# Patient Record
Sex: Male | Born: 1975 | Hispanic: Yes | Marital: Married | Smoking: Never smoker
Health system: Southern US, Community
[De-identification: ages and names within clinical notes are randomized; demographics above are authoritative.]

## PROBLEM LIST (undated history)

## (undated) DIAGNOSIS — I10 Essential (primary) hypertension: Secondary | ICD-10-CM

## (undated) DIAGNOSIS — M1990 Unspecified osteoarthritis, unspecified site: Secondary | ICD-10-CM

## (undated) DIAGNOSIS — E119 Type 2 diabetes mellitus without complications: Secondary | ICD-10-CM

## (undated) DIAGNOSIS — Z87442 Personal history of urinary calculi: Secondary | ICD-10-CM

## (undated) HISTORY — PX: CHOLECYSTECTOMY: SHX55

---

## 2004-10-09 ENCOUNTER — Emergency Department (HOSPITAL_COMMUNITY): Admission: EM | Admit: 2004-10-09 | Discharge: 2004-10-09 | Payer: Self-pay | Admitting: Emergency Medicine

## 2005-05-17 ENCOUNTER — Emergency Department (HOSPITAL_COMMUNITY): Admission: EM | Admit: 2005-05-17 | Discharge: 2005-05-17 | Payer: Self-pay | Admitting: Emergency Medicine

## 2005-12-12 ENCOUNTER — Inpatient Hospital Stay (HOSPITAL_COMMUNITY): Admission: EM | Admit: 2005-12-12 | Discharge: 2005-12-14 | Payer: Self-pay | Admitting: Emergency Medicine

## 2005-12-12 ENCOUNTER — Ambulatory Visit: Payer: Self-pay | Admitting: Cardiology

## 2006-07-24 ENCOUNTER — Emergency Department (HOSPITAL_COMMUNITY): Admission: EM | Admit: 2006-07-24 | Discharge: 2006-07-24 | Payer: Self-pay | Admitting: Emergency Medicine

## 2006-07-25 ENCOUNTER — Encounter: Admission: RE | Admit: 2006-07-25 | Discharge: 2006-07-25 | Payer: Self-pay | Admitting: Urology

## 2006-07-26 ENCOUNTER — Ambulatory Visit (HOSPITAL_BASED_OUTPATIENT_CLINIC_OR_DEPARTMENT_OTHER): Admission: RE | Admit: 2006-07-26 | Discharge: 2006-07-26 | Payer: Self-pay | Admitting: Urology

## 2006-09-12 ENCOUNTER — Emergency Department (HOSPITAL_COMMUNITY): Admission: EM | Admit: 2006-09-12 | Discharge: 2006-09-13 | Payer: Self-pay | Admitting: Emergency Medicine

## 2006-09-13 ENCOUNTER — Emergency Department (HOSPITAL_COMMUNITY): Admission: EM | Admit: 2006-09-13 | Discharge: 2006-09-13 | Payer: Self-pay | Admitting: Emergency Medicine

## 2011-09-17 ENCOUNTER — Emergency Department (HOSPITAL_COMMUNITY): Payer: Self-pay

## 2011-09-17 ENCOUNTER — Emergency Department (HOSPITAL_COMMUNITY)
Admission: EM | Admit: 2011-09-17 | Discharge: 2011-09-17 | Discharge status: Against Medical Advice | Payer: Self-pay | Attending: Emergency Medicine | Admitting: Emergency Medicine

## 2011-09-17 DIAGNOSIS — R109 Unspecified abdominal pain: Secondary | ICD-10-CM | POA: Insufficient documentation

## 2011-09-17 DIAGNOSIS — R112 Nausea with vomiting, unspecified: Secondary | ICD-10-CM | POA: Insufficient documentation

## 2011-09-17 DIAGNOSIS — R55 Syncope and collapse: Secondary | ICD-10-CM | POA: Insufficient documentation

## 2011-12-17 ENCOUNTER — Encounter (HOSPITAL_COMMUNITY): Payer: Self-pay | Admitting: *Deleted

## 2011-12-17 ENCOUNTER — Emergency Department (HOSPITAL_COMMUNITY)
Admission: EM | Admit: 2011-12-17 | Discharge: 2011-12-17 | Disposition: A | Discharge status: Home / Self Care | Payer: Self-pay | Attending: Emergency Medicine | Admitting: Emergency Medicine

## 2011-12-17 ENCOUNTER — Emergency Department (HOSPITAL_COMMUNITY): Payer: Self-pay

## 2011-12-17 DIAGNOSIS — K859 Acute pancreatitis without necrosis or infection, unspecified: Secondary | ICD-10-CM | POA: Insufficient documentation

## 2011-12-17 DIAGNOSIS — R110 Nausea: Secondary | ICD-10-CM | POA: Insufficient documentation

## 2011-12-17 DIAGNOSIS — I10 Essential (primary) hypertension: Secondary | ICD-10-CM | POA: Insufficient documentation

## 2011-12-17 DIAGNOSIS — N390 Urinary tract infection, site not specified: Secondary | ICD-10-CM | POA: Insufficient documentation

## 2011-12-17 DIAGNOSIS — R109 Unspecified abdominal pain: Secondary | ICD-10-CM | POA: Insufficient documentation

## 2011-12-17 HISTORY — DX: Essential (primary) hypertension: I10

## 2011-12-17 LAB — CBC
HCT: 45.5 % (ref 39.0–52.0)
MCHC: 34.9 g/dL (ref 30.0–36.0)
MCV: 83.6 fL (ref 78.0–100.0)
Platelets: 270 10*3/uL (ref 150–400)
RBC: 5.44 MIL/uL (ref 4.22–5.81)
RDW: 12.9 % (ref 11.5–15.5)
WBC: 11.4 10*3/uL — ABNORMAL HIGH (ref 4.0–10.5)

## 2011-12-17 LAB — URINALYSIS, ROUTINE W REFLEX MICROSCOPIC
Bilirubin Urine: NEGATIVE
Glucose, UA: NEGATIVE mg/dL
Ketones, ur: NEGATIVE mg/dL
Nitrite: NEGATIVE
Protein, ur: 30 mg/dL — AB
Specific Gravity, Urine: 1.022 (ref 1.005–1.030)
pH: 6.5 (ref 5.0–8.0)

## 2011-12-17 LAB — DIFFERENTIAL
Basophils Relative: 0 % (ref 0–1)
Eosinophils Absolute: 0.1 10*3/uL (ref 0.0–0.7)
Eosinophils Relative: 1 % (ref 0–5)
Lymphocytes Relative: 23 % (ref 12–46)
Monocytes Absolute: 0.8 10*3/uL (ref 0.1–1.0)
Monocytes Relative: 7 % (ref 3–12)
Neutro Abs: 7.9 10*3/uL — ABNORMAL HIGH (ref 1.7–7.7)

## 2011-12-17 LAB — COMPREHENSIVE METABOLIC PANEL
ALT: 38 U/L (ref 0–53)
AST: 25 U/L (ref 0–37)
Albumin: 3.6 g/dL (ref 3.5–5.2)
BUN: 14 mg/dL (ref 6–23)
CO2: 23 mEq/L (ref 19–32)
Calcium: 9.1 mg/dL (ref 8.4–10.5)
Chloride: 107 mEq/L (ref 96–112)
Creatinine, Ser: 0.89 mg/dL (ref 0.50–1.35)
GFR calc Af Amer: >90 mL/min (ref >90)
Potassium: 4 mEq/L (ref 3.5–5.1)
Sodium: 139 mEq/L (ref 135–145)
Total Bilirubin: 0.3 mg/dL (ref 0.3–1.2)
Total Protein: 7.7 g/dL (ref 6.0–8.3)

## 2011-12-17 LAB — URINE MICROSCOPIC-ADD ON

## 2011-12-17 LAB — URINE CULTURE
Colony Count: NO GROWTH
Culture  Setup Time: 201301211440

## 2011-12-17 LAB — LIPASE, BLOOD: Lipase: 125 U/L — ABNORMAL HIGH (ref 11–59)

## 2011-12-17 MED ORDER — IOHEXOL 300 MG/ML  SOLN
20.0000 mL | INTRAMUSCULAR | Status: AC
  Administered 2011-12-17 (×2): 20 mL via ORAL

## 2011-12-17 MED ORDER — IOHEXOL 300 MG/ML  SOLN
100.0000 mL | Freq: Once | INTRAMUSCULAR | Status: AC | PRN
  Administered 2011-12-17: 100 mL via INTRAVENOUS

## 2011-12-17 MED ORDER — ONDANSETRON HCL 4 MG/2ML IJ SOLN
4.0000 mg | Freq: Once | INTRAMUSCULAR | Status: AC
  Administered 2011-12-17: 4 mg via INTRAVENOUS
  Filled 2011-12-17: qty 2

## 2011-12-17 MED ORDER — HYDROCODONE-ACETAMINOPHEN 5-500 MG PO TABS: 1.0000 | ORAL_TABLET | Freq: Four times a day (QID) | ORAL | Status: AC | PRN

## 2011-12-17 MED ORDER — MORPHINE SULFATE 4 MG/ML IJ SOLN
6.0000 mg | Freq: Once | INTRAMUSCULAR | Status: AC
  Administered 2011-12-17: 6 mg via INTRAVENOUS
  Filled 2011-12-17: qty 2

## 2011-12-17 MED ORDER — CEPHALEXIN 500 MG PO CAPS: 500.0000 mg | ORAL_CAPSULE | Freq: Four times a day (QID) | ORAL | Status: AC

## 2011-12-17 MED ORDER — OMEPRAZOLE 20 MG PO CPDR: 20.0000 mg | DELAYED_RELEASE_CAPSULE | Freq: Every day | ORAL | Status: DC

## 2011-12-17 NOTE — ED Provider Notes (Signed)
History     CSN: [id number]  Arrival date & time 12/17/11  0750   First MD Initiated Contact with Patient 12/17/11 [phone]      Chief Complaint  Patient presents with  . Flank Pain    left sided    (Consider location/radiation/quality/duration/timing/severity/associated sxs/prior treatment) Patient is a 36 y.o. male presenting with abdominal pain. The history is provided by the patient.  Abdominal Pain The primary symptoms of the illness include abdominal pain and nausea. The primary symptoms of the illness do not include fever, vomiting, diarrhea, hematemesis or dysuria. The current episode started 3 to 5 hours ago. The onset of the illness was sudden. The problem has been gradually worsening.  Symptoms associated with the illness do not include chills, constipation or hematuria.  Pt states pain woke him up from sleep. Pain mainly on left upper abdomen. Does not radiate. States had a bowel movement this morning, it was black, but normal consistency. Reports nausea, denies vomiting. No similar pain in the past. Pain worsened with movement and palpation.   Past Medical History  Diagnosis Date  . Hypertension     Past Surgical History  Procedure Date  . Cholecystectomy     No family history on file.  History  Substance Use Topics  . Smoking status: Never Smoker   . Smokeless tobacco: Not on file  . Alcohol Use: No      Review of Systems  Constitutional: Negative for fever and chills.  Eyes: Negative.   Respiratory: Negative.   Cardiovascular: Negative.   Gastrointestinal: Positive for nausea and abdominal pain. Negative for vomiting, diarrhea, constipation and hematemesis.  Genitourinary: Negative for dysuria, hematuria, scrotal swelling and testicular pain.  Musculoskeletal: Negative.   Skin: Negative.   Neurological: Negative.   Psychiatric/Behavioral: Negative.     Allergies  Review of patient's allergies indicates no known allergies.  Home Medications  No  current outpatient prescriptions on file.  BP 140/85  Pulse 91  Temp 98 F (36.7 C)  Resp 20  SpO2 97%  Physical Exam  Constitutional: He is oriented to person, place, and time. He appears well-developed and well-nourished.       Uncomfortable appearing  HENT:  Head: Normocephalic.  Eyes: Conjunctivae are normal.  Neck: Neck supple.  Cardiovascular: Normal rate, regular rhythm and normal heart sounds.   Pulmonary/Chest: Effort normal and breath sounds normal. No respiratory distress. He has no wheezes.  Abdominal: Soft. Bowel sounds are normal. He exhibits no mass. There is no rebound and no guarding.       Left upper quadrant tenderness. No CVA tenderness.  Musculoskeletal: Normal range of motion.  Neurological: He is alert and oriented to person, place, and time.  Skin: Skin is warm and dry.  Psychiatric: He has a normal mood and affect.    ED Course  Procedures (including critical care time)  LUQ pain, nausea. Will get labs  Results for orders placed during the hospital encounter of 12/17/11  CBC      Component Value Range   WBC 11.4 (*) 4.0 - 10.5 (K/uL)   RBC 5.44  4.22 - 5.81 (MIL/uL)   Hemoglobin 15.9  13.0 - 17.0 (g/dL)   HCT 63.0  16.0 - 10.9 (%)   MCV 83.6  78.0 - 100.0 (fL)   MCH 29.2  26.0 - 34.0 (pg)   MCHC 34.9  30.0 - 36.0 (g/dL)   RDW 32.3  55.7 - 32.2 (%)   Platelets 270  150 - 400 (K/uL)  DIFFERENTIAL      Component Value Range   Neutrophils Relative 69  43 - 77 (%)   Neutro Abs 7.9 (*) 1.7 - 7.7 (K/uL)   Lymphocytes Relative 23  12 - 46 (%)   Lymphs Abs 2.6  0.7 - 4.0 (K/uL)   Monocytes Relative 7  3 - 12 (%)   Monocytes Absolute 0.8  0.1 - 1.0 (K/uL)   Eosinophils Relative 1  0 - 5 (%)   Eosinophils Absolute 0.1  0.0 - 0.7 (K/uL)   Basophils Relative 0  0 - 1 (%)   Basophils Absolute 0.1  0.0 - 0.1 (K/uL)  COMPREHENSIVE METABOLIC PANEL      Component Value Range   Sodium 139  135 - 145 (mEq/L)   Potassium 4.0  3.5 - 5.1 (mEq/L)   Chloride  107  96 - 112 (mEq/L)   CO2 23  19 - 32 (mEq/L)   Glucose, Bld 132 (*) 70 - 99 (mg/dL)   BUN 14  6 - 23 (mg/dL)   Creatinine, Ser 7.82  0.50 - 1.35 (mg/dL)   Calcium 9.1  8.4 - 95.6 (mg/dL)   Total Protein 7.7  6.0 - 8.3 (g/dL)   Albumin 3.6  3.5 - 5.2 (g/dL)   AST 25  0 - 37 (U/L)   ALT 38  0 - 53 (U/L)   Alkaline Phosphatase 75  39 - 117 (U/L)   Total Bilirubin 0.3  0.3 - 1.2 (mg/dL)   GFR calc non Af Amer >90  >90 (mL/min)   GFR calc Af Amer >90  >90 (mL/min)  LIPASE, BLOOD      Component Value Range   Lipase 125 (*) 11 - 59 (U/L)  URINALYSIS, ROUTINE W REFLEX MICROSCOPIC      Component Value Range   Color, Urine AMBER (*) YELLOW    APPearance CLOUDY (*) CLEAR    Specific Gravity, Urine 1.022  1.005 - 1.030    pH 6.5  5.0 - 8.0    Glucose, UA NEGATIVE  NEGATIVE (mg/dL)   Hgb urine dipstick LARGE (*) NEGATIVE    Bilirubin Urine NEGATIVE  NEGATIVE    Ketones, ur NEGATIVE  NEGATIVE (mg/dL)   Protein, ur 30 (*) NEGATIVE (mg/dL)   Urobilinogen, UA 0.2  0.0 - 1.0 (mg/dL)   Nitrite NEGATIVE  NEGATIVE    Leukocytes, UA SMALL (*) NEGATIVE   URINE MICROSCOPIC-ADD ON      Component Value Range   Squamous Epithelial / LPF RARE  RARE    WBC, UA 7-10  <3 (WBC/hpf)   RBC / HPF TOO NUMEROUS TO COUNT  <3 (RBC/hpf)   Bacteria, UA FEW (*) RARE     Lipase elevated. Will get CT for further evaluation.    Ct Abdomen Pelvis W Contrast  12/17/2011  *RADIOLOGY REPORT*  Clinical Data: Left flank pain, pancreatitis  CT ABDOMEN AND PELVIS WITH CONTRAST  Technique:  Multidetector CT imaging of the abdomen and pelvis was performed following the standard protocol during bolus administration of intravenous contrast.  Contrast: OMNIPAQUE IOHEXOL 300 MG/ML IV SOLN  Comparison: 07/24/2006  Findings: Lung bases are unremarkable.  Sagittal images of the spine shows disc space flattening with vacuum disc phenomenon and mild posterior endplate spurring at L5-S1 level.  Mild posterior spurring noted at  L1 L2 level.  The patient is status post cholecystectomy.  There is mild fatty infiltration of the liver.  No intrahepatic biliary ductal dilatation. No focal hepatic mass. Enhanced pancreas and left  adrenal gland is unremarkable.  There is a low density nodule in the right adrenal gland measures 1.7 cm probable adenoma.  The kidneys show symmetrical enhancement.  No hydronephrosis or hydroureter.  There is a probable cyst in lower pole of the right kidney measures 1.1 cm.  A cyst in upper pole of the left kidney measures 2.4 cm.  There is a cyst in the mid pole anterior aspect of the left kidney measures 2.7 cm increased from prior exam.  A cyst is noted in mid pole posterior aspect of the left kidney measures 1.5 C.  In axial image 35 there is a calcified calculus probable nonobstructive in the left UPJ measures 6.6 by 5.5 mm.  No distal ureteral calculi are noted bilaterally.  No small bowel obstruction.  No ascites or free air.  No adenopathy.  There is no evidence of pancreatitis.  No aortic aneurysm.  No pericecal inflammation.  Normal appendix is clearly visualized in axial image 48.  The urinary bladder is unremarkable.  Prostate gland seminal vesicles are unremarkable.  No destructive bony lesions are noted in pelvis.  Stool noted in the cecum and right colon.   IMPRESSION:  1.  Mild hepatic fatty infiltration. 2.  Probable right adrenal adenoma measures 1.7 cm.  Follow-up examination in 3-6 months is suggested to assure stability. 3. Bilateral renal cysts.  There is a calcified calculus probable nonobstructive in the left UPJ measures 6.6 x 5.5 mm. 4.  No small bowel obstruction. 5.  Normal appendix. 6.  Degenerative changes lumbar spine.  Original Report Authenticated By: Natasha Mead, M.D.   CT unremarkable. Pt's pain has resolved. He wants to go home. Will d/c home with close follow up.  1. Pancreatitis   2. Urinary tract infection      Medical screening examination/treatment/procedure(s) were  performed by non-physician practitioner and as supervising physician I was immediately available for consultation/collaboration. Osvaldo Human, M.D.        Lottie Mussel, PA 12/17/11 1630  Carleene Cooper III, MD 12/19/11 [phone]

## 2011-12-17 NOTE — ED Notes (Signed)
Patient reports left flank pain starting this am at 0400. Patient also states black stools with nausea associated.  No vomiting reported.

## 2011-12-17 NOTE — ED Notes (Signed)
Pt has urinal at bedside. Aware of need for sample.

## 2011-12-20 ENCOUNTER — Encounter (HOSPITAL_COMMUNITY): Payer: Self-pay

## 2011-12-20 ENCOUNTER — Emergency Department (HOSPITAL_COMMUNITY)
Admission: EM | Admit: 2011-12-20 | Discharge: 2011-12-20 | Disposition: A | Discharge status: Home / Self Care | Payer: Self-pay | Attending: Emergency Medicine | Admitting: Emergency Medicine

## 2011-12-20 DIAGNOSIS — N200 Calculus of kidney: Secondary | ICD-10-CM

## 2011-12-20 DIAGNOSIS — I10 Essential (primary) hypertension: Secondary | ICD-10-CM | POA: Insufficient documentation

## 2011-12-20 DIAGNOSIS — R109 Unspecified abdominal pain: Secondary | ICD-10-CM | POA: Insufficient documentation

## 2011-12-20 DIAGNOSIS — Z79899 Other long term (current) drug therapy: Secondary | ICD-10-CM | POA: Insufficient documentation

## 2011-12-20 DIAGNOSIS — N201 Calculus of ureter: Secondary | ICD-10-CM | POA: Insufficient documentation

## 2011-12-20 DIAGNOSIS — R319 Hematuria, unspecified: Secondary | ICD-10-CM | POA: Insufficient documentation

## 2011-12-20 LAB — URINALYSIS, ROUTINE W REFLEX MICROSCOPIC
Bilirubin Urine: NEGATIVE
Glucose, UA: NEGATIVE mg/dL
Ketones, ur: NEGATIVE mg/dL
Protein, ur: 30 mg/dL — AB
pH: 5.5 (ref 5.0–8.0)

## 2011-12-20 LAB — URINE MICROSCOPIC-ADD ON

## 2011-12-20 MED ORDER — ONDANSETRON 4 MG PO TBDP
8.0000 mg | ORAL_TABLET | Freq: Once | ORAL | Status: AC
  Administered 2011-12-20: 8 mg via ORAL
  Filled 2011-12-20: qty 2

## 2011-12-20 MED ORDER — HYDROMORPHONE HCL PF 1 MG/ML IJ SOLN
1.0000 mg | Freq: Once | INTRAMUSCULAR | Status: AC
  Administered 2011-12-20: 1 mg via INTRAVENOUS
  Filled 2011-12-20: qty 1

## 2011-12-20 MED ORDER — OXYCODONE-ACETAMINOPHEN 5-325 MG PO TABS: 2.0000 | ORAL_TABLET | ORAL | Status: AC | PRN

## 2011-12-20 NOTE — ED Notes (Signed)
Pt presents with LUQ abdominal pain that began again today.  Pt seen here for same last week, began abx treatment.  Pt reports onset of hematuria that began yesterday.  Pt denies any pain with voiding.

## 2011-12-20 NOTE — ED Provider Notes (Signed)
History     CSN: [id number]  Arrival date & time 12/20/11  1020   First MD Initiated Contact with Patient 12/20/11 1059      Chief Complaint  Patient presents with  . Abdominal Pain    (Consider location/radiation/quality/duration/timing/severity/associated sxs/prior treatment) HPI Comments: Patient presents left-sided abdominal pain. This started approximately 4 days ago. It comes and goes. He was seen here on Monday for the same type of pain. He had a CT scan of the abdomen and pelvis which did show a probable nonobstructing stone 4 x 5 cm in the left UPJ. He also had a very mildly elevated lipase at 125. Other blood work was unremarkable. His urine showed some blood but otherwise is unremarkable. He states that the pain came back worse this morning. It is nonradiating. He also noticed some blood in his urine with the pain today. He does have a history of kidney stones in the past but does not currently have a urologist. Denies any nausea vomiting or fevers. The pain is not worse with movement. Not worse with eating.  Patient is a 36 y.o. male presenting with abdominal pain. The history is provided by the patient.  Abdominal Pain The primary symptoms of the illness include abdominal pain. The primary symptoms of the illness do not include fever, fatigue, shortness of breath, nausea, vomiting or diarrhea.  Additional symptoms associated with the illness include hematuria. Symptoms associated with the illness do not include chills, diaphoresis, frequency or back pain.    Past Medical History  Diagnosis Date  . Hypertension     Past Surgical History  Procedure Date  . Cholecystectomy     No family history on file.  History  Substance Use Topics  . Smoking status: Never Smoker   . Smokeless tobacco: Not on file  . Alcohol Use: No      Review of Systems  Constitutional: Negative for fever, chills, diaphoresis and fatigue.  HENT: Negative for congestion, rhinorrhea and  sneezing.   Eyes: Negative.   Respiratory: Negative for cough, chest tightness and shortness of breath.   Cardiovascular: Negative for chest pain and leg swelling.  Gastrointestinal: Positive for abdominal pain. Negative for nausea, vomiting, diarrhea and blood in stool.  Genitourinary: Positive for hematuria. Negative for frequency, flank pain, difficulty urinating and testicular pain.  Musculoskeletal: Negative for back pain and arthralgias.  Skin: Negative for rash.  Neurological: Negative for dizziness, speech difficulty, weakness, numbness and headaches.    Allergies  Review of patient's allergies indicates no known allergies.  Home Medications   Current Outpatient Rx  Name Route Sig Dispense Refill  . AMLODIPINE BESYLATE 10 MG PO TABS Oral Take 10 mg by mouth daily.    . CEPHALEXIN 500 MG PO CAPS Oral Take 1 capsule (500 mg total) by mouth 4 (four) times daily. 28 capsule 0  . HYDROCODONE-ACETAMINOPHEN 5-500 MG PO TABS Oral Take 1-2 tablets by mouth every 6 (six) hours as needed for pain. 15 tablet 0  . LISINOPRIL 20 MG PO TABS Oral Take 20 mg by mouth daily.    Marland Kitchen OMEPRAZOLE 20 MG PO CPDR Oral Take 1 capsule (20 mg total) by mouth daily. 30 capsule 0  . OXYCODONE-ACETAMINOPHEN 5-325 MG PO TABS Oral Take 2 tablets by mouth every 4 (four) hours as needed for pain. 15 tablet 0    BP 148/97  Pulse 93  Temp(Src) 98.9 F (37.2 C) (Oral)  Resp 16  Ht 5\' 11"  (1.803 m)  Wt 265 lb (  120.203 kg)  BMI 36.96 kg/m2  SpO2 95%  Physical Exam  Constitutional: He is oriented to person, place, and time. He appears well-developed and well-nourished.  HENT:  Head: Normocephalic and atraumatic.  Eyes: Pupils are equal, round, and reactive to light.  Neck: Normal range of motion. Neck supple.  Cardiovascular: Normal rate, regular rhythm and normal heart sounds.   Pulmonary/Chest: Effort normal and breath sounds normal. No respiratory distress. He has no wheezes. He has no rales. He exhibits  no tenderness.  Abdominal: Soft. Bowel sounds are normal. There is tenderness. There is no rebound and no guarding.       Moderate tenderness to the left midabdomen. There is no pain in epigastric area. No CVA tenderness. No testicular or inguinal tenderness.  Musculoskeletal: Normal range of motion. He exhibits no edema.  Lymphadenopathy:    He has no cervical adenopathy.  Neurological: He is alert and oriented to person, place, and time.  Skin: Skin is warm and dry. No rash noted.  Psychiatric: He has a normal mood and affect.    ED Course  Procedures (including critical care time)  Results for orders placed during the hospital encounter of 12/20/11  URINALYSIS, ROUTINE W REFLEX MICROSCOPIC      Component Value Range   Color, Urine RED (*) YELLOW    APPearance CLOUDY (*) CLEAR    Specific Gravity, Urine 1.020  1.005 - 1.030    pH 5.5  5.0 - 8.0    Glucose, UA NEGATIVE  NEGATIVE (mg/dL)   Hgb urine dipstick LARGE (*) NEGATIVE    Bilirubin Urine NEGATIVE  NEGATIVE    Ketones, ur NEGATIVE  NEGATIVE (mg/dL)   Protein, ur 30 (*) NEGATIVE (mg/dL)   Urobilinogen, UA 0.2  0.0 - 1.0 (mg/dL)   Nitrite NEGATIVE  NEGATIVE    Leukocytes, UA SMALL (*) NEGATIVE   URINE MICROSCOPIC-ADD ON      Component Value Range   Squamous Epithelial / LPF RARE  RARE    WBC, UA 3-6  <3 (WBC/hpf)   RBC / HPF TOO NUMEROUS TO COUNT  <3 (RBC/hpf)   Bacteria, UA MANY (*) RARE    Ct Abdomen Pelvis W Contrast  12/17/2011  *RADIOLOGY REPORT*  Clinical Data: Left flank pain, pancreatitis  CT ABDOMEN AND PELVIS WITH CONTRAST  Technique:  Multidetector CT imaging of the abdomen and pelvis was performed following the standard protocol during bolus administration of intravenous contrast.  Contrast: OMNIPAQUE IOHEXOL 300 MG/ML IV SOLN  Comparison: 07/24/2006  Findings: Lung bases are unremarkable.  Sagittal images of the spine shows disc space flattening with vacuum disc phenomenon and mild posterior endplate  spurring at L5-S1 level.  Mild posterior spurring noted at L1 L2 level.  The patient is status post cholecystectomy.  There is mild fatty infiltration of the liver.  No intrahepatic biliary ductal dilatation. No focal hepatic mass. Enhanced pancreas and left adrenal gland is unremarkable.  There is a low density nodule in the right adrenal gland measures 1.7 cm probable adenoma.  The kidneys show symmetrical enhancement.  No hydronephrosis or hydroureter.  There is a probable cyst in lower pole of the right kidney measures 1.1 cm.  A cyst in upper pole of the left kidney measures 2.4 cm.  There is a cyst in the mid pole anterior aspect of the left kidney measures 2.7 cm increased from prior exam.  A cyst is noted in mid pole posterior aspect of the left kidney measures 1.5 C.  In  axial image 35 there is a calcified calculus probable nonobstructive in the left UPJ measures 6.6 by 5.5 mm.  No distal ureteral calculi are noted bilaterally.  No small bowel obstruction.  No ascites or free air.  No adenopathy.  There is no evidence of pancreatitis.  No aortic aneurysm.  No pericecal inflammation.  Normal appendix is clearly visualized in axial image 48.  The urinary bladder is unremarkable.  Prostate gland seminal vesicles are unremarkable.  No destructive bony lesions are noted in pelvis.  Stool noted in the cecum and right colon.   IMPRESSION:  1.  Mild hepatic fatty infiltration. 2.  Probable right adrenal adenoma measures 1.7 cm.  Follow-up examination in 3-6 months is suggested to assure stability. 3. Bilateral renal cysts.  There is a calcified calculus probable nonobstructive in the left UPJ measures 6.6 x 5.5 mm. 4.  No small bowel obstruction. 5.  Normal appendix. 6.  Degenerative changes lumbar spine.  Original Report Authenticated By: Natasha Mead, M.D.     No results found.   1. Kidney stone       MDM  Patient presents with left flank pain and hematuria. He does have a history kidney stones. His  symptoms are not consistent with pancreatitis. There was a noted 4 x 5 cm stone at the left UPJ on his prior CT scan this could be the cause of the patient's pain. Nothing else remarkable was noted on the CT scan. Do not feel that repeat imaging is indicated today. He has no fevers no vomiting, no other symptoms that would be consistent with pancreatitis. Do not feel that repeat blood work is indicated today will recheck his urine control his pain and reevaluate.  1300 patient is feeling much better after pain medication is ready to go home. Will discharge with pain medicine, referral to urology. He is aware of the right adrenal adenoma that needs followup      Rolan Bucco, MD 12/20/11 1301

## 2012-03-03 ENCOUNTER — Encounter (HOSPITAL_COMMUNITY): Payer: Self-pay | Admitting: *Deleted

## 2012-03-03 ENCOUNTER — Emergency Department (HOSPITAL_COMMUNITY): Payer: Self-pay

## 2012-03-03 ENCOUNTER — Emergency Department (HOSPITAL_COMMUNITY)
Admission: EM | Admit: 2012-03-03 | Discharge: 2012-03-03 | Disposition: A | Discharge status: Home / Self Care | Payer: Self-pay | Attending: Emergency Medicine | Admitting: Emergency Medicine

## 2012-03-03 DIAGNOSIS — N509 Disorder of male genital organs, unspecified: Secondary | ICD-10-CM | POA: Insufficient documentation

## 2012-03-03 DIAGNOSIS — M545 Low back pain, unspecified: Secondary | ICD-10-CM | POA: Insufficient documentation

## 2012-03-03 DIAGNOSIS — R109 Unspecified abdominal pain: Secondary | ICD-10-CM | POA: Insufficient documentation

## 2012-03-03 DIAGNOSIS — N50819 Testicular pain, unspecified: Secondary | ICD-10-CM

## 2012-03-03 DIAGNOSIS — I10 Essential (primary) hypertension: Secondary | ICD-10-CM | POA: Insufficient documentation

## 2012-03-03 DIAGNOSIS — M549 Dorsalgia, unspecified: Secondary | ICD-10-CM

## 2012-03-03 DIAGNOSIS — Z79899 Other long term (current) drug therapy: Secondary | ICD-10-CM | POA: Insufficient documentation

## 2012-03-03 LAB — DIFFERENTIAL
Basophils Relative: 0 % (ref 0–1)
Lymphocytes Relative: 21 % (ref 12–46)
Lymphs Abs: 2.3 10*3/uL (ref 0.7–4.0)
Monocytes Absolute: 0.8 10*3/uL (ref 0.1–1.0)
Monocytes Relative: 7 % (ref 3–12)
Neutro Abs: 7.9 10*3/uL — ABNORMAL HIGH (ref 1.7–7.7)
Neutrophils Relative %: 72 % (ref 43–77)

## 2012-03-03 LAB — COMPREHENSIVE METABOLIC PANEL
ALT: 31 U/L (ref 0–53)
Alkaline Phosphatase: 69 U/L (ref 39–117)
BUN: 13 mg/dL (ref 6–23)
CO2: 23 mEq/L (ref 19–32)
Chloride: 103 mEq/L (ref 96–112)
GFR calc Af Amer: >90 mL/min (ref >90)
Glucose, Bld: 102 mg/dL — ABNORMAL HIGH (ref 70–99)
Potassium: 4.5 mEq/L (ref 3.5–5.1)
Sodium: 134 mEq/L — ABNORMAL LOW (ref 135–145)
Total Bilirubin: 0.4 mg/dL (ref 0.3–1.2)
Total Protein: 7.2 g/dL (ref 6.0–8.3)

## 2012-03-03 LAB — CBC
HCT: 45.1 % (ref 39.0–52.0)
Hemoglobin: 15.4 g/dL (ref 13.0–17.0)
RBC: 5.35 MIL/uL (ref 4.22–5.81)
WBC: 11 10*3/uL — ABNORMAL HIGH (ref 4.0–10.5)

## 2012-03-03 LAB — URINALYSIS, MICROSCOPIC ONLY
Bilirubin Urine: NEGATIVE
Ketones, ur: NEGATIVE mg/dL
Nitrite: NEGATIVE
Protein, ur: NEGATIVE mg/dL
pH: 7 (ref 5.0–8.0)

## 2012-03-03 MED ORDER — OXYCODONE-ACETAMINOPHEN 5-325 MG PO TABS: 2.0000 | ORAL_TABLET | ORAL | Status: AC | PRN

## 2012-03-03 MED ORDER — HYDROMORPHONE HCL PF 1 MG/ML IJ SOLN
1.0000 mg | Freq: Once | INTRAMUSCULAR | Status: AC
  Administered 2012-03-03: 1 mg via INTRAVENOUS
  Filled 2012-03-03: qty 1

## 2012-03-03 MED ORDER — KETOROLAC TROMETHAMINE 30 MG/ML IJ SOLN: 30.0000 mg | Freq: Once | INTRAMUSCULAR | Status: DC

## 2012-03-03 MED ORDER — SODIUM CHLORIDE 0.9 % IV SOLN
INTRAVENOUS | Status: DC
  Administered 2012-03-03: 10:00:00 via INTRAVENOUS

## 2012-03-03 MED ORDER — TAMSULOSIN HCL 0.4 MG PO CAPS: 0.4000 mg | ORAL_CAPSULE | Freq: Every day | ORAL | Status: DC

## 2012-03-03 MED ORDER — KETOROLAC TROMETHAMINE 30 MG/ML IJ SOLN
30.0000 mg | Freq: Once | INTRAMUSCULAR | Status: AC
  Administered 2012-03-03: 30 mg via INTRAMUSCULAR
  Filled 2012-03-03: qty 1

## 2012-03-03 MED ORDER — ONDANSETRON HCL 4 MG/2ML IJ SOLN
4.0000 mg | Freq: Once | INTRAMUSCULAR | Status: AC
  Administered 2012-03-03: 4 mg via INTRAVENOUS
  Filled 2012-03-03: qty 2

## 2012-03-03 MED ORDER — ONDANSETRON HCL 4 MG PO TABS: 4.0000 mg | ORAL_TABLET | Freq: Four times a day (QID) | ORAL | Status: AC

## 2012-03-03 NOTE — ED Provider Notes (Signed)
History     CSN: [id number]  Arrival date & time 03/03/12  4098   First MD Initiated Contact with Patient 03/03/12 0915      Chief Complaint  Patient presents with  . Back Pain  . Abdominal Pain    (Consider location/radiation/quality/duration/timing/severity/associated sxs/prior treatment) Patient is a 36 y.o. male presenting with back pain, abdominal pain, and testicular pain. The history is provided by the patient. No language interpreter was used.  Back Pain  This is a new problem. The current episode started yesterday. The problem occurs constantly. The problem has not changed since onset.The pain is associated with no known injury. The quality of the pain is described as aching. Radiates to: r testicle. The pain is at a severity of 8/10. The pain is moderate. The symptoms are aggravated by bending and certain positions. Associated symptoms include abdominal pain. Pertinent negatives include no fever, no numbness, no bowel incontinence, no perianal numbness, no bladder incontinence, no dysuria, no pelvic pain, no leg pain, no paresthesias, no paresis, no tingling and no weakness.  Abdominal Pain The primary symptoms of the illness include abdominal pain. The primary symptoms of the illness do not include fever, nausea, vomiting, diarrhea or dysuria.  Additional symptoms associated with the illness include back pain. Symptoms associated with the illness do not include constipation, urgency, hematuria or frequency.  Testicle Pain This is a new problem. The current episode started today. The problem has been rapidly worsening. Associated symptoms include abdominal pain. Pertinent negatives include no fever, nausea, numbness, vomiting or weakness. The symptoms are aggravated by walking, twisting and bending. He has tried nothing for the symptoms.   Patient presents today with bilateral lower back pain x12 hours and right testicular pain x3 hours. Date of the pain in his back started around  9:30 last night. He was able to go to sleep and get up early this morning. At work testicular pain started in rapidly got worse. Denies swelling or erythema to the testicle. Denies blood in his urine. He does have a past medical history of kidney stones in the remote past. We will proceed with a testicular ultrasound to rule out torsion or infection. Then we will do a CT without contrast to rule out kidney stone. Patient has one partner and has been married for 18 years he uses no protection. States that a month ago his wife had some kind of vaginal infection not sure what is called but she is better now and she finished her antibiotics.  Past Medical History  Diagnosis Date  . Hypertension     Past Surgical History  Procedure Date  . Cholecystectomy     No family history on file.  History  Substance Use Topics  . Smoking status: Never Smoker   . Smokeless tobacco: Not on file  . Alcohol Use: No      Review of Systems  Unable to perform ROS Constitutional: Negative.  Negative for fever.  HENT: Negative.   Eyes: Negative.   Respiratory: Negative.   Cardiovascular: Negative.   Gastrointestinal: Positive for abdominal pain. Negative for nausea, vomiting, diarrhea, constipation and bowel incontinence.  Genitourinary: Positive for flank pain and testicular pain. Negative for bladder incontinence, dysuria, urgency, frequency, hematuria, scrotal swelling, difficulty urinating, penile pain and pelvic pain.  Musculoskeletal: Positive for back pain. Negative for gait problem.  Neurological: Negative.  Negative for dizziness, tingling, weakness, numbness and paresthesias.  Psychiatric/Behavioral: Negative.   All other systems reviewed and are negative.  Allergies  Review of patient's allergies indicates no known allergies.  Home Medications   Current Outpatient Rx  Name Route Sig Dispense Refill  . AMLODIPINE BESYLATE 10 MG PO TABS Oral Take 10 mg by mouth daily.    Marland Kitchen  LISINOPRIL 20 MG PO TABS Oral Take 20 mg by mouth daily.    Marland Kitchen OMEPRAZOLE 20 MG PO CPDR Oral Take 20 mg by mouth daily.      There were no vitals taken for this visit.  Physical Exam  Nursing note and vitals reviewed. Constitutional: He is oriented to person, place, and time. He appears well-developed and well-nourished.  HENT:  Head: Normocephalic.  Eyes: Conjunctivae and EOM are normal. Pupils are equal, round, and reactive to light.  Neck: Normal range of motion. Neck supple.  Cardiovascular: Normal rate.   Pulmonary/Chest: Effort normal.  Abdominal: Soft. Hernia confirmed negative in the right inguinal area and confirmed negative in the left inguinal area.  Genitourinary: Penis normal. Right testis shows tenderness. Right testis shows no mass and no swelling. Right testis is descended. Cremasteric reflex is not absent on the right side. Left testis shows no mass, no swelling and no tenderness. Left testis is descended. Cremasteric reflex is not absent on the left side.  Musculoskeletal: Normal range of motion.  Neurological: He is alert and oriented to person, place, and time.  Skin: Skin is warm and dry.  Psychiatric: He has a normal mood and affect.    ED Course  Procedures (including critical care time)   Labs Reviewed  CBC  DIFFERENTIAL  URINALYSIS, ROUTINE W REFLEX MICROSCOPIC  LIPASE, BLOOD  COMPREHENSIVE METABOLIC PANEL  URINALYSIS, WITH MICROSCOPIC   No results found.   No diagnosis found.    MDM  RL back pain and R testicular pain treated with dilaudid and zofran in the ER with relief.  U/s testicle negative for torsion or infection.  pmh of kidney stones.  Patient was given a strainer rx for pain meds and follow up with urology.  Instructed to return if worse. PMH of cholecystectomy.  Probable kidney stone pain.    Labs Reviewed  CBC - Abnormal; Notable for the following:    WBC 11.0 (*)    All other components within normal limits  DIFFERENTIAL -  Abnormal; Notable for the following:    Neutro Abs 7.9 (*)    All other components within normal limits  LIPASE, BLOOD - Abnormal; Notable for the following:    Lipase 61 (*)    All other components within normal limits  COMPREHENSIVE METABOLIC PANEL - Abnormal; Notable for the following:    Sodium 134 (*)    Glucose, Bld 102 (*)    All other components within normal limits  URINALYSIS, WITH MICROSCOPIC  LAB REPORT - SCANNED        Remi Haggard, NP 03/04/12 1007  Remi Haggard, NP 03/04/12 1007

## 2012-03-03 NOTE — ED Notes (Signed)
Pt reports started having bilateral lower back pain yesterday that is now radiating to right lower groin. Pt reports nausea this am. Pt reports hx of kidney stones.

## 2012-03-03 NOTE — Discharge Instructions (Signed)
Brett Hamilton we think your pain today is from a kidney stone.  You had a scan recently that showed you have stones.  The u/s of your testicle does not show any infection or torsion. Urine strainer, pain medicine, and nausea medicine. Followup with the urologist listed below. If you're right lower quadrant pain becomes worse return to the ER. Take the Flomax daily. Do not drive with the Percocet pain medication.  Back Exercises Back exercises help treat and prevent back injuries. The goal is to increase your strength in your belly (abdominal) and back muscles. These exercises can also help with flexibility. Start these exercises when told by your doctor. HOME CARE Back exercises include: Pelvic Tilt.  Lie on your back with your knees bent. Tilt your pelvis until the lower part of your back is against the floor. Hold this position 5 to 10 sec. Repeat this exercise 5 to 10 times.  Knee to Chest.  Pull 1 knee up against your chest and hold for 20 to 30 seconds. Repeat this with the other knee. This may be done with the other leg straight or bent, whichever feels better. Then, pull both knees up against your chest.  Sit-Ups or Curl-Ups.  Bend your knees 90 degrees. Start with tilting your pelvis, and do a partial, slow sit-up. Only lift your upper half 30 to 45 degrees off the floor. Take at least 2 to 3 seonds for each sit-up. Do not do sit-ups with your knees out straight. If partial sit-ups are difficult, simply do the above but with only tightening your belly (abdominal) muscles and holding it as told.  Hip-Lift.  Lie on your back with your knees flexed 90 degrees. Push down with your feet and shoulders as you raise your hips 2 inches off the floor. Hold for 10 seconds, repeat 5 to 10 times.  Back Arches.  Lie on your stomach. Prop yourself up on bent elbows. Slowly press on your hands, causing an arch in your low back. Repeat 3 to 5 times.  Shoulder-Lifts.  Lie face down with arms beside your  body. Keep hips and belly pressed to floor as you slowly lift your head and shoulders off the floor.  Do not overdo your exercises. Be careful in the beginning. Exercises may cause you some mild back discomfort. If the pain lasts for more than 15 minutes, stop the exercises until you see your doctor. Improvement with exercise for back problems is slow.  Document Released: 12/15/2010 Document Revised: 11/01/2011 Document Reviewed: 12/15/2010 [facility] Patient Information 2012 [place], [place].Back Exercises Back exercises help treat and prevent back injuries. The goal is to increase your strength in your belly (abdominal) and back muscles. These exercises can also help with flexibility. Start these exercises when told by your doctor. HOME CARE Back exercises include: Pelvic Tilt.  Lie on your back with your knees bent. Tilt your pelvis until the lower part of your back is against the floor. Hold this position 5 to 10 sec. Repeat this exercise 5 to 10 times.  Knee to Chest.  Pull 1 knee up against your chest and hold for 20 to 30 seconds. Repeat this with the other knee. This may be done with the other leg straight or bent, whichever feels better. Then, pull both knees up against your chest.  Sit-Ups or Curl-Ups.  Bend your knees 90 degrees. Start with tilting your pelvis, and do a partial, slow sit-up. Only lift your upper half 30 to 45 degrees off the floor.  Take at least 2 to 3 seonds for each sit-up. Do not do sit-ups with your knees out straight. If partial sit-ups are difficult, simply do the above but with only tightening your belly (abdominal) muscles and holding it as told.  Hip-Lift.  Lie on your back with your knees flexed 90 degrees. Push down with your feet and shoulders as you raise your hips 2 inches off the floor. Hold for 10 seconds, repeat 5 to 10 times.  Back Arches.  Lie on your stomach. Prop yourself up on bent elbows. Slowly press on your hands, causing an arch in your low  back. Repeat 3 to 5 times.  Shoulder-Lifts.  Lie face down with arms beside your body. Keep hips and belly pressed to floor as you slowly lift your head and shoulders off the floor.  Do not overdo your exercises. Be careful in the beginning. Exercises may cause you some mild back discomfort. If the pain lasts for more than 15 minutes, stop the exercises until you see your doctor. Improvement with exercise for back problems is slow.  Document Released: 12/15/2010 Document Revised: 11/01/2011 Document Reviewed: 12/15/2010 [facility] Patient Information 2012 [place], [place].Back Exercises Back exercises help treat and prevent back injuries. The goal is to increase your strength in your belly (abdominal) and back muscles. These exercises can also help with flexibility. Start these exercises when told by your doctor. HOME CARE Back exercises include: Pelvic Tilt.  Lie on your back with your knees bent. Tilt your pelvis until the lower part of your back is against the floor. Hold this position 5 to 10 sec. Repeat this exercise 5 to 10 times.  Knee to Chest.  Pull 1 knee up against your chest and hold for 20 to 30 seconds. Repeat this with the other knee. This may be done with the other leg straight or bent, whichever feels better. Then, pull both knees up against your chest.  Sit-Ups or Curl-Ups.  Bend your knees 90 degrees. Start with tilting your pelvis, and do a partial, slow sit-up. Only lift your upper half 30 to 45 degrees off the floor. Take at least 2 to 3 seonds for each sit-up. Do not do sit-ups with your knees out straight. If partial sit-ups are difficult, simply do the above but with only tightening your belly (abdominal) muscles and holding it as told.  Hip-Lift.  Lie on your back with your knees flexed 90 degrees. Push down with your feet and shoulders as you raise your hips 2 inches off the floor. Hold for 10 seconds, repeat 5 to 10 times.  Back Arches.  Lie on your stomach. Prop  yourself up on bent elbows. Slowly press on your hands, causing an arch in your low back. Repeat 3 to 5 times.  Shoulder-Lifts.  Lie face down with arms beside your body. Keep hips and belly pressed to floor as you slowly lift your head and shoulders off the floor.  Do not overdo your exercises. Be careful in the beginning. Exercises may cause you some mild back discomfort. If the pain lasts for more than 15 minutes, stop the exercises until you see your doctor. Improvement with exercise for back problems is slow.  Document Released: 12/15/2010 Document Revised: 11/01/2011 Document Reviewed: 12/15/2010 [facility] Patient Information 2012 [place], [place].Back Exercises Back exercises help treat and prevent back injuries. The goal is to increase your strength in your belly (abdominal) and back muscles. These exercises can also help with flexibility. Start these exercises when told by  your doctor. HOME CARE Back exercises include: Pelvic Tilt.  Lie on your back with your knees bent. Tilt your pelvis until the lower part of your back is against the floor. Hold this position 5 to 10 sec. Repeat this exercise 5 to 10 times.  Knee to Chest.  Pull 1 knee up against your chest and hold for 20 to 30 seconds. Repeat this with the other knee. This may be done with the other leg straight or bent, whichever feels better. Then, pull both knees up against your chest.  Sit-Ups or Curl-Ups.  Bend your knees 90 degrees. Start with tilting your pelvis, and do a partial, slow sit-up. Only lift your upper half 30 to 45 degrees off the floor. Take at least 2 to 3 seonds for each sit-up. Do not do sit-ups with your knees out straight. If partial sit-ups are difficult, simply do the above but with only tightening your belly (abdominal) muscles and holding it as told.  Hip-Lift.  Lie on your back with your knees flexed 90 degrees. Push down with your feet and shoulders as you raise your hips 2 inches off the floor. Hold  for 10 seconds, repeat 5 to 10 times.  Back Arches.  Lie on your stomach. Prop yourself up on bent elbows. Slowly press on your hands, causing an arch in your low back. Repeat 3 to 5 times.  Shoulder-Lifts.  Lie face down with arms beside your body. Keep hips and belly pressed to floor as you slowly lift your head and shoulders off the floor.  Do not overdo your exercises. Be careful in the beginning. Exercises may cause you some mild back discomfort. If the pain lasts for more than 15 minutes, stop the exercises until you see your doctor. Improvement with exercise for back problems is slow.  Document Released: 12/15/2010 Document Revised: 11/01/2011 Document Reviewed: 12/15/2010 [facility] Patient Information 2012 [place], [place].

## 2012-03-05 NOTE — ED Provider Notes (Addendum)
Medical screening examination/treatment/procedure(s) were conducted as a shared visit with non-physician practitioner(s) and myself.  I personally evaluated the patient during the encounter  States feels similar to renal stones but usually does not have testicular pain. Has min RLQ ttp. No CVAT. Feeling better. Recent abd imaging. Patient given strict precautions for return-- if persists, worsens, fever may need CT A/P to r/u appendicitis. Given typical nature of pain for nephrolithiasis, will not perform advanced imaging today.   Forbes Cellar, MD 03/05/12 1610  Forbes Cellar, MD 03/05/12 [phone]

## 2012-11-21 ENCOUNTER — Encounter (HOSPITAL_COMMUNITY): Payer: Self-pay | Admitting: *Deleted

## 2012-11-21 ENCOUNTER — Emergency Department (HOSPITAL_COMMUNITY): Payer: 59

## 2012-11-21 ENCOUNTER — Emergency Department (HOSPITAL_COMMUNITY)
Admission: EM | Admit: 2012-11-21 | Discharge: 2012-11-22 | Disposition: A | Discharge status: Home / Self Care | Payer: 59 | Attending: Emergency Medicine | Admitting: Emergency Medicine

## 2012-11-21 DIAGNOSIS — N200 Calculus of kidney: Secondary | ICD-10-CM | POA: Insufficient documentation

## 2012-11-21 DIAGNOSIS — Z9089 Acquired absence of other organs: Secondary | ICD-10-CM | POA: Insufficient documentation

## 2012-11-21 DIAGNOSIS — I10 Essential (primary) hypertension: Secondary | ICD-10-CM | POA: Insufficient documentation

## 2012-11-21 DIAGNOSIS — Z79899 Other long term (current) drug therapy: Secondary | ICD-10-CM | POA: Insufficient documentation

## 2012-11-21 LAB — COMPREHENSIVE METABOLIC PANEL
ALT: 36 U/L (ref 0–53)
AST: 31 U/L (ref 0–37)
CO2: 22 mEq/L (ref 19–32)
Calcium: 9.3 mg/dL (ref 8.4–10.5)
Creatinine, Ser: 1.03 mg/dL (ref 0.50–1.35)
GFR calc Af Amer: >90 mL/min (ref >90)
GFR calc non Af Amer: >90 mL/min (ref >90)
Sodium: 137 mEq/L (ref 135–145)
Total Protein: 8.1 g/dL (ref 6.0–8.3)

## 2012-11-21 LAB — CBC WITH DIFFERENTIAL/PLATELET
Basophils Absolute: 0 10*3/uL (ref 0.0–0.1)
Eosinophils Absolute: 0.1 10*3/uL (ref 0.0–0.7)
Eosinophils Relative: 1 % (ref 0–5)
HCT: 44.7 % (ref 39.0–52.0)
MCH: 27.8 pg (ref 26.0–34.0)
MCHC: 33.1 g/dL (ref 30.0–36.0)
MCV: 83.9 fL (ref 78.0–100.0)
Monocytes Absolute: 0.9 10*3/uL (ref 0.1–1.0)
Platelets: 279 10*3/uL (ref 150–400)
RDW: 13.3 % (ref 11.5–15.5)

## 2012-11-21 LAB — URINALYSIS, ROUTINE W REFLEX MICROSCOPIC
Glucose, UA: NEGATIVE mg/dL
Leukocytes, UA: NEGATIVE
Protein, ur: 100 mg/dL — AB
Specific Gravity, Urine: 1.024 (ref 1.005–1.030)
Urobilinogen, UA: 0.2 mg/dL (ref 0.0–1.0)

## 2012-11-21 LAB — URINE MICROSCOPIC-ADD ON

## 2012-11-21 MED ORDER — ONDANSETRON HCL 4 MG/2ML IJ SOLN
4.0000 mg | Freq: Once | INTRAMUSCULAR | Status: AC
  Administered 2012-11-21: 4 mg via INTRAVENOUS
  Filled 2012-11-21: qty 2

## 2012-11-21 MED ORDER — MORPHINE SULFATE 4 MG/ML IJ SOLN
4.0000 mg | Freq: Once | INTRAMUSCULAR | Status: AC
  Administered 2012-11-21: 4 mg via INTRAVENOUS
  Filled 2012-11-21: qty 1

## 2012-11-21 MED ORDER — SODIUM CHLORIDE 0.9 % IV BOLUS (SEPSIS)
1000.0000 mL | Freq: Once | INTRAVENOUS | Status: AC
  Administered 2012-11-21: 1000 mL via INTRAVENOUS

## 2012-11-21 NOTE — ED Notes (Signed)
The pt is c/o lt upper abd pain since this am.  He crawled out of the w/c and lay in the floor at triage. The pt is hyperventilating.  No nv or diarrhea.  No known injury

## 2012-11-22 ENCOUNTER — Emergency Department (HOSPITAL_COMMUNITY): Payer: 59

## 2012-11-22 MED ORDER — ONDANSETRON 4 MG PO TBDP: 4.0000 mg | ORAL_TABLET | Freq: Three times a day (TID) | ORAL | Status: DC | PRN

## 2012-11-22 MED ORDER — OXYCODONE-ACETAMINOPHEN 5-325 MG PO TABS: 2.0000 | ORAL_TABLET | ORAL | Status: DC | PRN

## 2012-11-22 NOTE — ED Provider Notes (Signed)
History     CSN: [id number]  Arrival date & time 11/21/12  1755   First MD Initiated Contact with Patient 11/21/12 2150      Chief Complaint  Patient presents with  . Abdominal Pain    (Consider location/radiation/quality/duration/timing/severity/associated sxs/prior treatment) HPI Comments: Patient is a 36 year old male with a past medical history of kidney stones and a surgical history of cholecystectomy. The pain is located in LUQ and does radiate around to left flank. The pain is described as sharp and severe. The pain started gradually and progressively worsened since the onset. No alleviating/aggravating factors. The patient has tried nothing for symptoms without relief. Associated symptoms include nothing. Patient denies fever, headache, NVD, chest pain, SOB, dysuria, constipation.     Patient is a 36 y.o. male presenting with abdominal pain.  Abdominal Pain The primary symptoms of the illness include abdominal pain.    Past Medical History  Diagnosis Date  . Hypertension   . Kidney stones     Past Surgical History  Procedure Date  . Cholecystectomy     No family history on file.  History  Substance Use Topics  . Smoking status: Never Smoker   . Smokeless tobacco: Not on file  . Alcohol Use: No      Review of Systems  Gastrointestinal: Positive for abdominal pain.  Genitourinary: Positive for flank pain.  All other systems reviewed and are negative.    Allergies  Review of patient's allergies indicates no known allergies.  Home Medications   Current Outpatient Rx  Name  Route  Sig  Dispense  Refill  . AMLODIPINE BESYLATE 10 MG PO TABS   Oral   Take 10 mg by mouth daily.         Marland Kitchen LISINOPRIL-HYDROCHLOROTHIAZIDE 20-12.5 MG PO TABS   Oral   Take 1 tablet by mouth daily.           BP 111/73  Pulse 90  Temp 98.3 F (36.8 C) (Oral)  Resp 18  SpO2 96%  Physical Exam  Nursing note and vitals reviewed. Constitutional: He is oriented to  person, place, and time. He appears well-developed and well-nourished. No distress.  HENT:  Head: Normocephalic and atraumatic.  Eyes: Conjunctivae normal and EOM are normal. No scleral icterus.  Neck: Normal range of motion. Neck supple.  Cardiovascular: Normal rate and regular rhythm.  Exam reveals no gallop and no friction rub.   No murmur heard. Pulmonary/Chest: Effort normal and breath sounds normal. He has no wheezes. He has no rales. He exhibits no tenderness.  Abdominal: Soft. He exhibits no distension. There is tenderness. There is no rebound.       Mild LUQ tenderness to palpation.   Genitourinary:       Left CVA tenderness  Musculoskeletal: Normal range of motion.  Neurological: He is alert and oriented to person, place, and time. Coordination normal.       Speech is goal-oriented. Moves limbs without ataxia.   Skin: Skin is warm and dry.  Psychiatric: He has a normal mood and affect. His behavior is normal.    ED Course  Procedures (including critical care time)  Labs Reviewed  CBC WITH DIFFERENTIAL - Abnormal; Notable for the following:    WBC 14.1 (*)     Neutro Abs 9.6 (*)     All other components within normal limits  COMPREHENSIVE METABOLIC PANEL - Abnormal; Notable for the following:    Glucose, Bld 144 (*)  Total Bilirubin 0.2 (*)     All other components within normal limits  LIPASE, BLOOD - Abnormal; Notable for the following:    Lipase 77 (*)     All other components within normal limits  URINALYSIS, ROUTINE W REFLEX MICROSCOPIC - Abnormal; Notable for the following:    APPearance CLOUDY (*)     Hgb urine dipstick LARGE (*)     Protein, ur 100 (*)     All other components within normal limits  URINE MICROSCOPIC-ADD ON - Abnormal; Notable for the following:    Casts GRANULAR CAST (*)     All other components within normal limits   Ct Abdomen Pelvis Wo Contrast  11/22/2012  *RADIOLOGY REPORT*  Clinical Data: Flank pain  CT ABDOMEN AND PELVIS WITHOUT  CONTRAST  Technique:  Multidetector CT imaging of the abdomen and pelvis was performed following the standard protocol without intravenous contrast.  Comparison: 12/17/2011  Findings: Scar versus plate-like atelectasis noted in the left base.  There is a no pleural or pericardial effusion.  The liver appears within normal limits.  The the patient is status post cholecystectomy.  No biliary dilatation.  The pancreas is unremarkable.  Normal appearance of the spleen.  The right adrenal gland nodule is again noted measuring 2.2 cm, image 29 series 2.  This is unchanged from previous exam.  The left adrenal gland appears normal.  Bilateral renal cysts are again noted and appear similar to previous exam.  These are incompletely characterized on today's exam due to lack of intravenous contrast material. Punctate 2-3 mm stone is noted within the upper pole of the right kidney, image number 117/series 5.  There is a stone within the left renal pelvis which measures 7 x 1.7 cm, image 40/series 2.  Previously this measured 6 x 7 cm.  There is no significant high-grade obstructive uropathy. Mild fat stranding surrounds the proximal left ureter.  No hydroureter or ureterolithiasis noted.  Urinary bladder appears normal.  The prostate gland and seminal vesicles appear normal.  No upper abdominal adenopathy.  There is no pelvic or inguinal adenopathy identified.  The stomach and the small bowel loops are unremarkable.  The appendix is visualized and appears normal.  Normal appearance of the colon.  No free fluid or abnormal fluid collections identified within the abdomen or pelvis.  Review of the visualized bony structures is significant for L5 S1 degenerative disc disease.  IMPRESSION:  1.  Increase size of the left renal pelvis stone.  There is mild left-sided pelvocaliectasis.  Periureteral fat stranding surrounds the proximal left ureter which may be the sequela of recently passed urinary calculi. 2.  Punctate, nonobstructing  stone noted within the upper pole the right kidney.   Original Report Authenticated By: Signa Kell, M.D.    Dg Chest 2 View  11/21/2012  *RADIOLOGY REPORT*  Clinical Data: Abdominal pain, left-sided chest pain  CHEST - 2 VIEW  Comparison: None  Findings: The heart size and mediastinal contours are within normal limits.  Both lungs are clear.  The visualized skeletal structures are unremarkable.  IMPRESSION: Negative exam.   Original Report Authenticated By: Signa Kell, M.D.    Dg Abd 2 Views  11/21/2012  *RADIOLOGY REPORT*  Clinical Data:  abdominal pain for 2 weeks  ABDOMEN - 2 VIEW  Comparison: 12/17/2011  Findings: Bowel gas pattern appears nonobstructed.  No dilated loops of small bowel or air-fluid levels identified.  Gas and stool noted within the colon up to the rectum.  There is small bowel dilatation. Left renal calculus is again noted.  IMPRESSION:  1.  Nonobstructive bowel gas pattern.   Original Report Authenticated By: Signa Kell, M.D.      1. Renal calculi       MDM  12:39 AM Labs unremarkable. Urinalysis shows hemoglobin. Acute abdominal series shows left renal calculi. Patient will have CT scan to further evaluation stones. Patient will have morphine for pain.   1:03 AM Patient has a non obstructing renal stone on the left side. I will discharge him with pain control and instructions to return with worsening or concerning symptoms.       Emilia Beck, PA-C 11/22/12 1550

## 2012-11-22 NOTE — ED Notes (Signed)
Patient asks for water. Explained he cannot until his doctor approves it.

## 2012-11-25 NOTE — ED Provider Notes (Signed)
Medical screening examination/treatment/procedure(s) were performed by non-physician practitioner and as supervising physician I was immediately available for consultation/collaboration.  Jennell Janosik K Kersten Salmons, MD 11/25/12 0825 

## 2012-12-08 ENCOUNTER — Ambulatory Visit (INDEPENDENT_AMBULATORY_CARE_PROVIDER_SITE_OTHER): Payer: 59 | Admitting: Family Medicine

## 2012-12-08 ENCOUNTER — Ambulatory Visit: Payer: 59

## 2012-12-08 VITALS — BP 133/82 | HR 97 | Temp 98.5°F | Resp 18 | Ht 72.0 in | Wt 275.0 lb

## 2012-12-08 DIAGNOSIS — Z87442 Personal history of urinary calculi: Secondary | ICD-10-CM

## 2012-12-08 DIAGNOSIS — N201 Calculus of ureter: Secondary | ICD-10-CM

## 2012-12-08 DIAGNOSIS — R109 Unspecified abdominal pain: Secondary | ICD-10-CM

## 2012-12-08 DIAGNOSIS — H109 Unspecified conjunctivitis: Secondary | ICD-10-CM

## 2012-12-08 LAB — POCT URINALYSIS DIPSTICK
Bilirubin, UA: NEGATIVE
Ketones, UA: NEGATIVE
Protein, UA: NEGATIVE
pH, UA: 7.5

## 2012-12-08 LAB — POCT UA - MICROSCOPIC ONLY
Casts, Ur, LPF, POC: NEGATIVE
Crystals, Ur, HPF, POC: NEGATIVE
Yeast, UA: NEGATIVE

## 2012-12-08 MED ORDER — OXYCODONE-ACETAMINOPHEN 5-325 MG PO TABS: ORAL_TABLET | ORAL | Status: DC

## 2012-12-08 MED ORDER — KETOROLAC TROMETHAMINE 60 MG/2ML IM SOLN
60.0000 mg | Freq: Once | INTRAMUSCULAR | Status: AC
  Administered 2012-12-08: 60 mg via INTRAMUSCULAR

## 2012-12-08 MED ORDER — TAMSULOSIN HCL 0.4 MG PO CAPS: 0.4000 mg | ORAL_CAPSULE | Freq: Every day | ORAL | Status: DC

## 2012-12-08 MED ORDER — OFLOXACIN 0.3 % OP SOLN: OPHTHALMIC | Status: DC

## 2012-12-08 NOTE — Patient Instructions (Signed)
Stay off of work today and tomorrow because of the eye infection  Drink lots of fluids to keep your self urinating regularly. Take the Flomax 1 pill daily to help relax the muscles in the ureter so that the stone might pass easier. Use the pain medicine as necessary.  If not better by Wednesday please call back out here and we will refer you back over to the urologist.

## 2012-12-08 NOTE — Progress Notes (Signed)
Subjective: Patient has been having pain in his right flank for several days. The pain radiates down to the right testicle. He has a history of several kidney stones in the past, with one urologic procedure to get one out. He's had a good deal of pain now. He works as a Production designer, theatre/television/film at [employer].  He also has been having eyes burning and irritating him for a week. This morning they were matted together.  Objective: Eyes both are mildly erythematous. Fundi benign. The lids look okay. No crusting is noted right now but I think he is washed it off.  He appears in some distress. His right CVA is tender. The abdomen is very tender around the right flank and right lower quadrant. Genitalia not examined.   Assessment: Right flank and abdomen pain, suspicious for kidney stone History of kidney stones Conjunctivitis  Plan: Ofloxacin ophthalmic drops KUB Urinalysis  Results for orders placed in visit on 12/08/12  POCT URINALYSIS DIPSTICK      Component Value Range   Color, UA yellow     Clarity, UA clear     Glucose, UA neg     Bilirubin, UA neg     Ketones, UA neg     Spec Grav, UA 1.020     Blood, UA moderate     pH, UA 7.5     Protein, UA neg     Urobilinogen, UA 0.2     Nitrite, UA neg     Leukocytes, UA Negative    POCT UA - MICROSCOPIC ONLY      Component Value Range   WBC, Ur, HPF, POC 1-3     RBC, urine, microscopic 20-25     Bacteria, U Microscopic trace     Mucus, UA trace     Epithelial cells, urine per micros 1-3     Crystals, Ur, HPF, POC neg     Casts, Ur, LPF, POC neg     Yeast, UA neg      [facility] reading (PRIMARY) by  Dr. Alwyn Ren No kidney stones noted  Imp:  Ureterolithiasis (right) Conjunctivitis.

## 2012-12-13 ENCOUNTER — Other Ambulatory Visit: Payer: Self-pay | Admitting: Family Medicine

## 2012-12-13 DIAGNOSIS — N201 Calculus of ureter: Secondary | ICD-10-CM

## 2013-02-18 ENCOUNTER — Other Ambulatory Visit: Payer: Self-pay | Admitting: Family Medicine

## 2013-02-18 DIAGNOSIS — R319 Hematuria, unspecified: Secondary | ICD-10-CM

## 2013-02-19 ENCOUNTER — Ambulatory Visit
Admission: RE | Admit: 2013-02-19 | Discharge: 2013-02-19 | Disposition: A | Discharge status: Home / Self Care | Payer: 59 | Source: Ambulatory Visit | Attending: Family Medicine | Admitting: Family Medicine

## 2013-02-19 DIAGNOSIS — R319 Hematuria, unspecified: Secondary | ICD-10-CM

## 2013-02-26 ENCOUNTER — Other Ambulatory Visit: Payer: Self-pay | Admitting: Family Medicine

## 2013-02-26 ENCOUNTER — Ambulatory Visit
Admission: RE | Admit: 2013-02-26 | Discharge: 2013-02-26 | Disposition: A | Discharge status: Home / Self Care | Payer: 59 | Source: Ambulatory Visit | Attending: Family Medicine | Admitting: Family Medicine

## 2013-02-26 DIAGNOSIS — M25551 Pain in right hip: Secondary | ICD-10-CM

## 2013-02-27 ENCOUNTER — Other Ambulatory Visit: Payer: Self-pay | Admitting: Urology

## 2013-02-27 DIAGNOSIS — N200 Calculus of kidney: Secondary | ICD-10-CM

## 2013-03-09 ENCOUNTER — Encounter (HOSPITAL_COMMUNITY): Payer: Self-pay

## 2013-03-24 ENCOUNTER — Encounter (HOSPITAL_COMMUNITY): Payer: Self-pay

## 2013-03-24 ENCOUNTER — Encounter (HOSPITAL_COMMUNITY)
Admission: RE | Admit: 2013-03-24 | Discharge: 2013-03-24 | Disposition: A | Discharge status: Home / Self Care | Payer: 59 | Source: Ambulatory Visit | Attending: Urology | Admitting: Urology

## 2013-03-24 HISTORY — DX: Unspecified osteoarthritis, unspecified site: M19.90

## 2013-03-24 HISTORY — DX: Personal history of urinary calculi: Z87.442

## 2013-03-24 LAB — SURGICAL PCR SCREEN
MRSA, PCR: NEGATIVE
Staphylococcus aureus: NEGATIVE

## 2013-03-24 LAB — BASIC METABOLIC PANEL
CO2: 27 mEq/L (ref 19–32)
Calcium: 9.4 mg/dL (ref 8.4–10.5)
Chloride: 98 mEq/L (ref 96–112)
Glucose, Bld: 89 mg/dL (ref 70–99)
Potassium: 4.2 mEq/L (ref 3.5–5.1)
Sodium: 133 mEq/L — ABNORMAL LOW (ref 135–145)

## 2013-03-24 LAB — CBC
Hemoglobin: 15.1 g/dL (ref 13.0–17.0)
Platelets: 255 10*3/uL (ref 150–400)
RBC: 5.32 MIL/uL (ref 4.22–5.81)
WBC: 14.3 10*3/uL — ABNORMAL HIGH (ref 4.0–10.5)

## 2013-03-24 NOTE — Progress Notes (Signed)
Abnormal CBC faxed to Dr. Isabel Caprice thru [facility]

## 2013-03-24 NOTE — Progress Notes (Signed)
03/24/13 0819  OBSTRUCTIVE SLEEP APNEA  Do you snore loudly (loud enough to be heard through closed doors)?  1  Do you often feel tired, fatigued, or sleepy during the daytime? 1  Has anyone observed you stop breathing during your sleep? 0  Do you have, or are you being treated for high blood pressure? 1  BMI more than 35 kg/m2? 1  Age over 39 years old? 0  Neck circumference greater than 40 cm/18 inches? 1  Gender: 1  Obstructive Sleep Apnea Score 6  Score 4 or greater  Results sent to PCP

## 2013-03-24 NOTE — Patient Instructions (Addendum)
Brett Hamilton  03/24/2013                           YOUR PROCEDURE IS SCHEDULED ON: 03/30/13               PLEASE REPORT TO RADIOLOGY AT : 9:00 AM               CALL THIS NUMBER IF ANY PROBLEMS THE DAY OF SURGERY :               [phone]                      REMEMBER:   Do not eat food or drink liquids AFTER MIDNIGHT   Take these medicines the morning of surgery with A SIP OF WATER: AMLODIPINE / MAY TAKE ULTRAM OR PERCOCET IF NEEDED FOR PAIN   Do not wear jewelry, make-up   Do not wear lotions, powders, or perfumes.   Do not shave legs or underarms 12 hrs. before surgery (men may shave face)  Do not bring valuables to the hospital.  Contacts, dentures or bridgework may not be worn into surgery.  Leave suitcase in the car. After surgery it may be brought to your room.  For patients admitted to the hospital more than one night, checkout time is 11:00                          The day of discharge.   Patients discharged the day of surgery will not be allowed to drive home                             If going home same day of surgery, must have someone stay with you first                           24 hrs at home and arrange for some one to drive you home from hospital.    Special Instructions:   Please read over the following fact sheets that you were given:               1. MRSA  INFORMATION                      2. [name] PREPARING FOR SURGERY SHEET                                                X_____________________________________________________________________        Failure to follow these instructions may result in cancellation of your surgery

## 2013-03-25 ENCOUNTER — Other Ambulatory Visit: Payer: Self-pay | Admitting: Radiology

## 2013-03-27 NOTE — H&P (Signed)
History of Present Illness   Brett Hamilton presents today as a referral from [facility] for further assessment and treatment of an 18 mm left renal pelvic calculus. Brett Hamilton is currently 37 years of age. He has had multiple bouts of nephrolithiasis in the past, approximately 7-8 clinical stone events. He reports requiring ESWL in the distant past and several ureteroscopic procedures. In 2007, he was cared for here by Dr. Aldean Ast for a distal ureteral stone. That stone turned out to be calcium oxalate and was primarily calcium oxalate monohydrate. The patient began experiencing right flank pain, which radiated down in the testicles and certainly sounded suspicious for an acute stone event. He had undergone CT imaging, which revealed no right-sided renal calculi. The patient was, however, noted to have an 18 mm stone in the renal pelvis of the left kidney. I did review those images. There was no significant hydronephrosis at that time. He has intermittently had some left flank pain. I suspect this stone is potentially ball valving in and out of the ureteropelvic junction. The stone measures approximately 18 mm x 8 mm. It does appear fairly dense with a Hounsfield unit between 1100 and 1200.       Past Medical History Problems  1. History of  Hypertension 401.9  Surgical History Problems  1. History of  Gallbladder Surgery  Current Meds 1. AmLODIPine Besylate 10 MG Oral Tablet; Therapy: (Recorded:03Apr2014) to 2. Lisinopril-Hydrochlorothiazide 20-12.5 MG Oral Tablet; Therapy: (Recorded:03Apr2014) to  Allergies Medication  1. No Known Drug Allergies  Family History Problems  1. Paternal history of  Family Health Status - Father's Age 33 2. Maternal history of  Family Health Status - Mother's Age 36 3. Family history of  Family Health Status Number Of Children 1 son/ 2 daughters 4. Paternal history of  Nephrolithiasis 5. Paternal grandfather's history of  Prostate Cancer  V16.42  Social History Problems    Caffeine Use 2-3 qd   Marital History - Currently Married Denied    History of  Alcohol Use   History of  Tobacco Use  Review of Systems Genitourinary, constitutional, skin, eye, otolaryngeal, hematologic/lymphatic, cardiovascular, pulmonary, endocrine, musculoskeletal, gastrointestinal, neurological and psychiatric system(s) were reviewed and pertinent findings if present are noted.  Genitourinary: urinary frequency, nocturia, hematuria and erectile dysfunction.  Musculoskeletal: back pain.  Psychiatric: depression.    Vitals Vital Signs [Data Includes: Last 1 Day]  03Apr2014 01:21PM  BMI Calculated: 37.25 BSA Calculated: 2.44 Height: 6 ft  Weight: 275 lb  Blood Pressure: 141 / 88 Temperature: 96.8 F Heart Rate: 72  Physical Exam Constitutional: Well nourished and well developed . No acute distress.  ENT:. The ears and nose are normal in appearance.  Neck: The appearance of the neck is normal and no neck mass is present.  Pulmonary: No respiratory distress and normal respiratory rhythm and effort.  Cardiovascular: Heart rate and rhythm are normal . No peripheral edema.  Abdomen: The abdomen is soft and nontender. No masses are palpated. No CVA tenderness. No hernias are palpable. No hepatosplenomegaly noted.  Genitourinary: Examination of the penis demonstrates no discharge, no masses, no lesions and a normal meatus. The penis is uncircumcised. The scrotum is without lesions. The right epididymis is palpably normal and non-tender. The left epididymis is palpably normal and non-tender. The right testis is non-tender and without masses. The left testis is non-tender and without masses.  Skin: Normal skin turgor, no visible rash and no visible skin lesions.  Neuro/Psych:. Mood and affect are appropriate.  Results/Data Urine [Data Includes: Last 1 Day]   03Apr2014  COLOR YELLOW   APPEARANCE CLEAR   SPECIFIC GRAVITY 1.030   pH 6.0    GLUCOSE NEG mg/dL  BILIRUBIN NEG   KETONE NEG mg/dL  BLOOD LARGE   PROTEIN 30 mg/dL  UROBILINOGEN 0.2 mg/dL  NITRITE NEG   LEUKOCYTE ESTERASE TRACE   SQUAMOUS EPITHELIAL/HPF RARE   WBC 7-10 WBC/hpf  RBC 21-50 RBC/hpf  BACTERIA FEW   CRYSTALS NONE SEEN   CASTS NONE SEEN   Other MUCUS NOTED    Assessment Assessed  1. Pyuria 791.9 2. Nephrolithiasis 592.0  Plan Health Maintenance (V70.0)  1. UA With REFLEX  Done: 03Apr2014 12:50PM Nephrolithiasis (592.0)  2. Hydrocodone-Acetaminophen 5-325 MG Oral Tablet; Take 1-2 tablets every 4-6 hours for pain;  Therapy: 03Apr2014 to (Last Rx:03Apr2014) Pyuria (791.9)  3. URINE CULTURE  Requested for: 03Apr2014 4. Follow-up Schedule Surgery Office  Follow-up  Requested for: 03Apr2014  Discussion/Summary   Mr. Brett Hamilton has a rather large stone in his left renal pelvis. Again, I suspect it is intermittently causing some irritation and obstruction resulting in intermittent flank discomfort and gross hematuria. This stone clearly should be treated. There are several options. This includes ureteroscopy with Holmium laser lithotripsy, ESWL with or without replacement of a double-J stent or a percutaneous nephrolithotomy. There are some advantages and disadvantages of all these approaches. A percutaneous nephrolithotomy would certainly be the most invasive option but would also have the best chance of resolving this in a definitive manner with 1 treatment. This stone is borderline for ESWL and certainly appears dense based on Hounsfield units. This would suggest potentially a lower likelihood of good fragmentation. That would be quite a bit of stone burden to try to pass spontaneously. In addition, previous stone analysis did reveal the stone to be mostly calcium oxalate monohydrate, which can be very hard stones. We did discuss the procedure and recovery issues, risks, benefits. We will set this up on a nonemergent basis for sometime in the next few  weeks, also looking at his work schedule as well as our schedule and operative time availability.

## 2013-03-30 ENCOUNTER — Ambulatory Visit (HOSPITAL_COMMUNITY)
Admission: RE | Admit: 2013-03-30 | Discharge: 2013-03-30 | Disposition: A | Discharge status: Home / Self Care | Payer: 59 | Source: Ambulatory Visit | Attending: Urology | Admitting: Urology

## 2013-03-30 ENCOUNTER — Encounter (HOSPITAL_COMMUNITY): Payer: Self-pay | Admitting: Anesthesiology

## 2013-03-30 ENCOUNTER — Ambulatory Visit (HOSPITAL_COMMUNITY): Payer: 59

## 2013-03-30 ENCOUNTER — Observation Stay (HOSPITAL_COMMUNITY)
Admission: RE | Admit: 2013-03-30 | Discharge: 2013-03-31 | Disposition: A | Discharge status: Home / Self Care | Payer: 59 | Source: Ambulatory Visit | Attending: Urology | Admitting: Urology

## 2013-03-30 ENCOUNTER — Encounter (HOSPITAL_COMMUNITY): Payer: Self-pay | Admitting: General Practice

## 2013-03-30 ENCOUNTER — Encounter (HOSPITAL_COMMUNITY)
Admission: RE | Disposition: A | Discharge status: Home / Self Care | Payer: Self-pay | Source: Ambulatory Visit | Attending: Urology

## 2013-03-30 ENCOUNTER — Encounter (HOSPITAL_COMMUNITY): Payer: Self-pay

## 2013-03-30 ENCOUNTER — Ambulatory Visit (HOSPITAL_COMMUNITY): Payer: 59 | Admitting: Anesthesiology

## 2013-03-30 VITALS — BP 157/97 | HR 65 | Temp 98.7°F | Resp 11

## 2013-03-30 DIAGNOSIS — N200 Calculus of kidney: Secondary | ICD-10-CM

## 2013-03-30 DIAGNOSIS — Z79899 Other long term (current) drug therapy: Secondary | ICD-10-CM | POA: Insufficient documentation

## 2013-03-30 DIAGNOSIS — I10 Essential (primary) hypertension: Secondary | ICD-10-CM | POA: Insufficient documentation

## 2013-03-30 DIAGNOSIS — R109 Unspecified abdominal pain: Secondary | ICD-10-CM | POA: Insufficient documentation

## 2013-03-30 DIAGNOSIS — Z01812 Encounter for preprocedural laboratory examination: Secondary | ICD-10-CM | POA: Insufficient documentation

## 2013-03-30 DIAGNOSIS — E669 Obesity, unspecified: Secondary | ICD-10-CM | POA: Insufficient documentation

## 2013-03-30 HISTORY — PX: NEPHROLITHOTOMY: SHX5134

## 2013-03-30 LAB — BASIC METABOLIC PANEL
CO2: 21 mEq/L (ref 19–32)
Calcium: 8.8 mg/dL (ref 8.4–10.5)
Chloride: 105 mEq/L (ref 96–112)
Glucose, Bld: 105 mg/dL — ABNORMAL HIGH (ref 70–99)
Potassium: 4.9 mEq/L (ref 3.5–5.1)
Sodium: 134 mEq/L — ABNORMAL LOW (ref 135–145)

## 2013-03-30 LAB — CBC
Hemoglobin: 16.2 g/dL (ref 13.0–17.0)
MCV: 81.2 fL (ref 78.0–100.0)
Platelets: 312 10*3/uL (ref 150–400)
RBC: 5.65 MIL/uL (ref 4.22–5.81)
WBC: 22 10*3/uL — ABNORMAL HIGH (ref 4.0–10.5)

## 2013-03-30 LAB — APTT: aPTT: 25 seconds (ref 24–37)

## 2013-03-30 SURGERY — NEPHROLITHOTOMY PERCUTANEOUS
Anesthesia: General | Laterality: Left | Wound class: Clean Contaminated

## 2013-03-30 MED ORDER — AMLODIPINE BESYLATE 10 MG PO TABS
10.0000 mg | ORAL_TABLET | Freq: Every day | ORAL | Status: DC
  Administered 2013-03-31: 10 mg via ORAL
  Filled 2013-03-30 (×2): qty 1

## 2013-03-30 MED ORDER — MIDAZOLAM HCL 2 MG/2ML IJ SOLN
INTRAMUSCULAR | Status: AC
  Filled 2013-03-30: qty 6

## 2013-03-30 MED ORDER — MIDAZOLAM HCL 2 MG/2ML IJ SOLN
INTRAMUSCULAR | Status: DC | PRN
  Administered 2013-03-30 (×3): 1 mg via INTRAVENOUS

## 2013-03-30 MED ORDER — SODIUM CHLORIDE 0.9 % IR SOLN
Status: DC | PRN
  Administered 2013-03-30: 6000 mL

## 2013-03-30 MED ORDER — IOHEXOL 300 MG/ML  SOLN
INTRAMUSCULAR | Status: DC | PRN
  Administered 2013-03-30: 10 mL

## 2013-03-30 MED ORDER — MEPERIDINE HCL 50 MG/ML IJ SOLN: 6.2500 mg | INTRAMUSCULAR | Status: DC | PRN

## 2013-03-30 MED ORDER — GLYCOPYRROLATE 0.2 MG/ML IJ SOLN
INTRAMUSCULAR | Status: DC | PRN
  Administered 2013-03-30: .8 mg via INTRAVENOUS

## 2013-03-30 MED ORDER — CIPROFLOXACIN IN D5W 400 MG/200ML IV SOLN
400.0000 mg | Freq: Once | INTRAVENOUS | Status: AC
  Administered 2013-03-30: 400 mg via INTRAVENOUS

## 2013-03-30 MED ORDER — KCL IN DEXTROSE-NACL 20-5-0.45 MEQ/L-%-% IV SOLN
INTRAVENOUS | Status: DC
  Administered 2013-03-30: 1000 mL via INTRAVENOUS
  Administered 2013-03-31: 06:00:00 via INTRAVENOUS
  Filled 2013-03-30 (×5): qty 1000

## 2013-03-30 MED ORDER — AMLODIPINE BESYLATE 10 MG PO TABS
10.0000 mg | ORAL_TABLET | Freq: Once | ORAL | Status: AC
  Administered 2013-03-30: 10 mg via ORAL
  Filled 2013-03-30: qty 1

## 2013-03-30 MED ORDER — FENTANYL CITRATE 0.05 MG/ML IJ SOLN
INTRAMUSCULAR | Status: DC | PRN
  Administered 2013-03-30: 100 ug via INTRAVENOUS
  Administered 2013-03-30: 50 ug via INTRAVENOUS

## 2013-03-30 MED ORDER — SODIUM CHLORIDE 0.9 % IV SOLN
INTRAVENOUS | Status: DC | PRN
  Administered 2013-03-30: 12:00:00 via INTRAVENOUS

## 2013-03-30 MED ORDER — ONDANSETRON HCL 4 MG/2ML IJ SOLN
INTRAMUSCULAR | Status: AC
  Filled 2013-03-30: qty 2

## 2013-03-30 MED ORDER — LIDOCAINE HCL 1 % IJ SOLN
INTRAMUSCULAR | Status: AC
  Filled 2013-03-30: qty 20

## 2013-03-30 MED ORDER — PROPOFOL 10 MG/ML IV BOLUS
INTRAVENOUS | Status: DC | PRN
  Administered 2013-03-30: 200 mg via INTRAVENOUS

## 2013-03-30 MED ORDER — CEFAZOLIN SODIUM 1-5 GM-% IV SOLN
1.0000 g | Freq: Three times a day (TID) | INTRAVENOUS | Status: DC
  Administered 2013-03-30 – 2013-03-31 (×3): 1 g via INTRAVENOUS
  Filled 2013-03-30 (×5): qty 50

## 2013-03-30 MED ORDER — PROMETHAZINE HCL 25 MG/ML IJ SOLN: 6.2500 mg | INTRAMUSCULAR | Status: DC | PRN

## 2013-03-30 MED ORDER — SUCCINYLCHOLINE CHLORIDE 20 MG/ML IJ SOLN
INTRAMUSCULAR | Status: DC | PRN
  Administered 2013-03-30: 140 mg via INTRAVENOUS

## 2013-03-30 MED ORDER — LISINOPRIL-HYDROCHLOROTHIAZIDE 20-12.5 MG PO TABS: 1.0000 | ORAL_TABLET | Freq: Every day | ORAL | Status: DC

## 2013-03-30 MED ORDER — HYDROMORPHONE HCL PF 1 MG/ML IJ SOLN
INTRAMUSCULAR | Status: AC
  Filled 2013-03-30: qty 1

## 2013-03-30 MED ORDER — FENTANYL CITRATE 0.05 MG/ML IJ SOLN
INTRAMUSCULAR | Status: DC | PRN
  Administered 2013-03-30: 100 ug via INTRAVENOUS
  Administered 2013-03-30: 150 ug via INTRAVENOUS

## 2013-03-30 MED ORDER — OXYCODONE-ACETAMINOPHEN 5-325 MG PO TABS
1.0000 | ORAL_TABLET | ORAL | Status: DC | PRN
  Administered 2013-03-30 – 2013-03-31 (×4): 2 via ORAL
  Filled 2013-03-30 (×4): qty 2

## 2013-03-30 MED ORDER — DEXAMETHASONE SODIUM PHOSPHATE 10 MG/ML IJ SOLN
INTRAMUSCULAR | Status: DC | PRN
  Administered 2013-03-30: 10 mg via INTRAVENOUS

## 2013-03-30 MED ORDER — ONDANSETRON HCL 4 MG/2ML IJ SOLN
4.0000 mg | INTRAMUSCULAR | Status: DC | PRN
  Administered 2013-03-30 (×2): 4 mg via INTRAVENOUS
  Filled 2013-03-30: qty 2

## 2013-03-30 MED ORDER — ACETAMINOPHEN 10 MG/ML IV SOLN
INTRAVENOUS | Status: DC | PRN
  Administered 2013-03-30: 1000 mg via INTRAVENOUS

## 2013-03-30 MED ORDER — OXYCODONE HCL 5 MG/5ML PO SOLN
5.0000 mg | Freq: Once | ORAL | Status: DC | PRN
  Filled 2013-03-30: qty 5

## 2013-03-30 MED ORDER — MIDAZOLAM HCL 5 MG/5ML IJ SOLN
INTRAMUSCULAR | Status: DC | PRN
  Administered 2013-03-30: 1 mg via INTRAVENOUS

## 2013-03-30 MED ORDER — LACTATED RINGERS IV SOLN
INTRAVENOUS | Status: DC
  Administered 2013-03-30 (×2): 1000 mL via INTRAVENOUS

## 2013-03-30 MED ORDER — NEOSTIGMINE METHYLSULFATE 1 MG/ML IJ SOLN
INTRAMUSCULAR | Status: DC | PRN
  Administered 2013-03-30: 4 mg via INTRAVENOUS

## 2013-03-30 MED ORDER — CEFAZOLIN SODIUM-DEXTROSE 2-3 GM-% IV SOLR
INTRAVENOUS | Status: AC
  Filled 2013-03-30: qty 50

## 2013-03-30 MED ORDER — CEFAZOLIN SODIUM-DEXTROSE 2-3 GM-% IV SOLR
2.0000 g | Freq: Once | INTRAVENOUS | Status: AC
  Administered 2013-03-30: 2 g via INTRAVENOUS

## 2013-03-30 MED ORDER — CIPROFLOXACIN IN D5W 400 MG/200ML IV SOLN
INTRAVENOUS | Status: AC
  Filled 2013-03-30: qty 200

## 2013-03-30 MED ORDER — HYDROMORPHONE HCL PF 1 MG/ML IJ SOLN
0.2500 mg | INTRAMUSCULAR | Status: DC | PRN
Start: 2013-03-30 — End: 2013-03-30
  Administered 2013-03-30 (×4): 0.5 mg via INTRAVENOUS

## 2013-03-30 MED ORDER — SODIUM CHLORIDE 0.9 % IV SOLN
Freq: Once | INTRAVENOUS | Status: AC
  Administered 2013-03-30: 20 mL/h via INTRAVENOUS

## 2013-03-30 MED ORDER — ACETAMINOPHEN 10 MG/ML IV SOLN: 1000.0000 mg | Freq: Once | INTRAVENOUS | Status: DC | PRN

## 2013-03-30 MED ORDER — ACETAMINOPHEN 10 MG/ML IV SOLN
INTRAVENOUS | Status: AC
  Filled 2013-03-30: qty 100

## 2013-03-30 MED ORDER — ONDANSETRON HCL 4 MG/2ML IJ SOLN
INTRAMUSCULAR | Status: DC | PRN
  Administered 2013-03-30 (×2): 2 mg via INTRAVENOUS

## 2013-03-30 MED ORDER — FENTANYL CITRATE 0.05 MG/ML IJ SOLN
25.0000 ug | INTRAMUSCULAR | Status: DC | PRN
  Administered 2013-03-30: 50 ug via INTRAVENOUS

## 2013-03-30 MED ORDER — DEXTROSE 5 % IV SOLN
3.0000 g | INTRAVENOUS | Status: DC
  Filled 2013-03-30: qty 3000

## 2013-03-30 MED ORDER — FENTANYL CITRATE 0.05 MG/ML IJ SOLN
INTRAMUSCULAR | Status: AC
  Filled 2013-03-30: qty 2

## 2013-03-30 MED ORDER — IOHEXOL 300 MG/ML  SOLN
10.0000 mL | Freq: Once | INTRAMUSCULAR | Status: AC | PRN
  Administered 2013-03-30: 1 mL

## 2013-03-30 MED ORDER — LACTATED RINGERS IV SOLN
INTRAVENOUS | Status: DC | PRN
  Administered 2013-03-30: 12:00:00 via INTRAVENOUS

## 2013-03-30 MED ORDER — OXYCODONE HCL 5 MG PO TABS: 5.0000 mg | ORAL_TABLET | Freq: Once | ORAL | Status: DC | PRN

## 2013-03-30 MED ORDER — LISINOPRIL 20 MG PO TABS
20.0000 mg | ORAL_TABLET | Freq: Every day | ORAL | Status: DC
  Administered 2013-03-30 – 2013-03-31 (×2): 20 mg via ORAL
  Filled 2013-03-30 (×2): qty 1

## 2013-03-30 MED ORDER — HYDROMORPHONE HCL PF 1 MG/ML IJ SOLN
0.5000 mg | INTRAMUSCULAR | Status: DC | PRN
  Administered 2013-03-30 – 2013-03-31 (×3): 1 mg via INTRAVENOUS
  Filled 2013-03-30 (×2): qty 1

## 2013-03-30 MED ORDER — HYDROCHLOROTHIAZIDE 12.5 MG PO CAPS
12.5000 mg | ORAL_CAPSULE | Freq: Every day | ORAL | Status: DC
  Administered 2013-03-30 – 2013-03-31 (×2): 12.5 mg via ORAL
  Filled 2013-03-30 (×2): qty 1

## 2013-03-30 MED ORDER — CEFAZOLIN SODIUM 1-5 GM-% IV SOLN
INTRAVENOUS | Status: AC
  Filled 2013-03-30: qty 50

## 2013-03-30 MED ORDER — FENTANYL CITRATE 0.05 MG/ML IJ SOLN
INTRAMUSCULAR | Status: AC
  Filled 2013-03-30: qty 6

## 2013-03-30 MED ORDER — OXYBUTYNIN CHLORIDE 5 MG PO TABS
5.0000 mg | ORAL_TABLET | Freq: Three times a day (TID) | ORAL | Status: DC | PRN
  Filled 2013-03-30: qty 1

## 2013-03-30 MED ORDER — LIDOCAINE HCL (CARDIAC) 20 MG/ML IV SOLN
INTRAVENOUS | Status: DC | PRN
  Administered 2013-03-30: 100 mg via INTRAVENOUS

## 2013-03-30 SURGICAL SUPPLY — 43 items
BAG URINE DRAINAGE (UROLOGICAL SUPPLIES) ×2 IMPLANT
BASKET ZERO TIP NITINOL 2.4FR (BASKET) IMPLANT
BENZOIN TINCTURE PRP APPL 2/3 (GAUZE/BANDAGES/DRESSINGS) ×2 IMPLANT
CATH FOLEY 2W COUNCIL 20FR 5CC (CATHETERS) IMPLANT
CATH FOLEY 2W COUNCIL 5CC 18FR (CATHETERS) ×2 IMPLANT
CATH ROBINSON RED A/P 20FR (CATHETERS) IMPLANT
CATH X-FORCE N30 NEPHROSTOMY (TUBING) ×2 IMPLANT
CLOTH BEACON ORANGE TIMEOUT ST (SAFETY) ×2 IMPLANT
COVER SURGICAL LIGHT HANDLE (MISCELLANEOUS) ×2 IMPLANT
DRAPE C-ARM 42X72 X-RAY (DRAPES) ×2 IMPLANT
DRAPE CAMERA CLOSED 9X96 (DRAPES) ×2 IMPLANT
DRAPE LINGEMAN PERC (DRAPES) ×2 IMPLANT
DRAPE SURG IRRIG POUCH 19X23 (DRAPES) ×2 IMPLANT
DRSG TEGADERM 8X12 (GAUZE/BANDAGES/DRESSINGS) ×4 IMPLANT
GLOVE BIOGEL M STRL SZ7.5 (GLOVE) ×14 IMPLANT
GOWN STRL REIN XL XLG (GOWN DISPOSABLE) ×4 IMPLANT
GUIDEWIRE AMPLAZ .035X145 (WIRE) ×2 IMPLANT
KIT BASIN OR (CUSTOM PROCEDURE TRAY) ×2 IMPLANT
LASER FIBER DISP (UROLOGICAL SUPPLIES) IMPLANT
LASER FIBER DISP 1000U (UROLOGICAL SUPPLIES) IMPLANT
MANIFOLD NEPTUNE II (INSTRUMENTS) ×2 IMPLANT
NS IRRIG 1000ML POUR BTL (IV SOLUTION) ×2 IMPLANT
PACK BASIC VI WITH GOWN DISP (CUSTOM PROCEDURE TRAY) ×2 IMPLANT
PAD ABD 7.5X8 STRL (GAUZE/BANDAGES/DRESSINGS) ×4 IMPLANT
POSITIONER SURGICAL ARM (MISCELLANEOUS) ×4 IMPLANT
PROBE EHL 9 FR 470CM (MISCELLANEOUS) IMPLANT
PROBE LITHOCLAST ULTRA 3.8X403 (UROLOGICAL SUPPLIES) ×2 IMPLANT
PROBE PNEUMATIC 1.0MMX570MM (UROLOGICAL SUPPLIES) ×2 IMPLANT
SET IRRIG Y TYPE TUR BLADDER L (SET/KITS/TRAYS/PACK) IMPLANT
SET WARMING FLUID IRRIGATION (MISCELLANEOUS) ×2 IMPLANT
SHEATH PEELAWAY SET 9 (SHEATH) ×2 IMPLANT
SPONGE GAUZE 4X4 12PLY (GAUZE/BANDAGES/DRESSINGS) ×2 IMPLANT
SPONGE LAP 4X18 X RAY DECT (DISPOSABLE) ×2 IMPLANT
STONE CATCHER W/TUBE ADAPTER (UROLOGICAL SUPPLIES) ×2 IMPLANT
SUT SILK 2 0 30  PSL (SUTURE) ×1
SUT SILK 2 0 30 PSL (SUTURE) ×1 IMPLANT
SYR 20CC LL (SYRINGE) ×4 IMPLANT
SYRINGE 10CC LL (SYRINGE) ×2 IMPLANT
TOWEL OR 17X26 10 PK STRL BLUE (TOWEL DISPOSABLE) ×2 IMPLANT
TOWEL OR NON WOVEN STRL DISP B (DISPOSABLE) ×2 IMPLANT
TRAY FOLEY CATH 14FRSI W/METER (CATHETERS) ×2 IMPLANT
TUBING CONNECTING 10 (TUBING) ×4 IMPLANT
WATER STERILE IRR 1500ML POUR (IV SOLUTION) IMPLANT

## 2013-03-30 NOTE — Op Note (Signed)
Preoperative diagnosis: Left renal calculus Postoperative diagnosis: Same  Procedure: Percutaneous left nephrolithotomy   Surgeon: Valetta Fuller M.D. /  Anesthesia: Gen.  Indications: Patient was  Recently evaluated in our office after being diagnosed with 18 mm left renal pelvic stone. Hounsfield units on the stone were quite high suggesting that percutaneous nephrolithotomy may be a more appropriate approach versus were discussed in detail with the patient elected to proceed. The patient is had successful placement of a left percutaneous nephrostomy tube by Dr. Oley Balm in the department of interventional radiology this morning. Dr. Deanne Coffer also was present in the OR for the initial portion of the procedure to gain appropriate access to the left renal pelvis.     Technique and findings: Patient was brought the operating room. He had successful induction general endotracheal anesthesia. Foley catheter was placed. The patient was then flipped to the prone position with great care taken to pad all extremities. He was then prepped and draped in usual manner. The nephrostomy tube was removed and 2 wires placed down to the bladder both a working wire and safety wire. The fashion was dilated in a standard manner with the balloon and access sheath was placed. A 2 cm stone was encountered in the renal pelvis. The lithotrite instrument was used to fracture stone into approximately a dozen pieces were drawn removed with a grasper. No obvious residual fragments were noted both on direct visualization through the nephroscope as well as with fluoroscopic interpretation. There is no evidence of obvious collecting system injury. Over the guidewire we placed an 19 French nephrostomy tube. Contrast did not reveal any extravasation and an open ureteral pelvic junction. The patient was brought to recovery room stable condition having had no obvious complications or problems per

## 2013-03-30 NOTE — Preoperative (Addendum)
Beta Blockers   Reason not to administer Beta Blockers:Not Applicable 

## 2013-03-30 NOTE — Progress Notes (Signed)
Admitted to room 1321 from PACU. Patient with foley and left PCN. Oriented to room and unit. Wants Pain medication. Released orders await pharmacy to verify so can give prn.

## 2013-03-30 NOTE — Transfer of Care (Signed)
Immediate Anesthesia Transfer of Care Note  Patient: Brett Hamilton  Procedure(s) Performed: Procedure(s): NEPHROLITHOTOMY PERCUTANEOUS (Left)  Patient Location: PACU  Anesthesia Type:General  Level of Consciousness: awake, oriented, patient cooperative, lethargic and responds to stimulation  Airway & Oxygen Therapy: Patient Spontanous Breathing and Patient connected to face mask oxygen  Post-op Assessment: Report given to PACU RN, Post -op Vital signs reviewed and stable and Patient moving all extremities  Post vital signs: Reviewed and stable  Complications: No apparent anesthesia complications

## 2013-03-30 NOTE — Interval H&P Note (Signed)
History and Physical Interval Note:  03/30/2013 11:26 AM  Brett Hamilton  has presented today for surgery, with the diagnosis of left renal calculus  The various methods of treatment have been discussed with the patient and family. After consideration of risks, benefits and other options for treatment, the patient has consented to  Procedure(s): NEPHROLITHOTOMY PERCUTANEOUS (Left) as a surgical intervention .  The patient's history has been reviewed, patient examined, no change in status, stable for surgery.  I have reviewed the patient's chart and labs.  Questions were answered to the patient's satisfaction.     [name] S

## 2013-03-30 NOTE — Anesthesia Preprocedure Evaluation (Addendum)
Anesthesia Evaluation  Patient identified by MRN, date of birth, ID band Patient awake    Reviewed: Allergy & Precautions, H&P , NPO status , Patient's Chart, lab work & pertinent test results  Airway Mallampati: II TM Distance: >3 FB Neck ROM: Full    Dental  (+) Dental Advisory Given and Teeth Intact   Pulmonary neg pulmonary ROS,  breath sounds clear to auscultation        Cardiovascular hypertension, Pt. on medications negative cardio ROS  Rhythm:Regular Rate:Normal     Neuro/Psych negative neurological ROS  negative psych ROS   GI/Hepatic negative GI ROS, Neg liver ROS,   Endo/Other  negative endocrine ROS  Renal/GU negative Renal ROS     Musculoskeletal negative musculoskeletal ROS (+)   Abdominal (+) + obese,   Peds  Hematology negative hematology ROS (+)   Anesthesia Other Findings   Reproductive/Obstetrics                          Anesthesia Physical Anesthesia Plan  ASA: III  Anesthesia Plan: General   Post-op Pain Management:    Induction: Intravenous  Airway Management Planned: Oral ETT  Additional Equipment:   Intra-op Plan:   Post-operative Plan: Extubation in OR  Informed Consent: I have reviewed the patients History and Physical, chart, labs and discussed the procedure including the risks, benefits and alternatives for the proposed anesthesia with the patient or authorized representative who has indicated his/her understanding and acceptance.   Dental advisory given  Plan Discussed with: CRNA  Anesthesia Plan Comments:         Anesthesia Quick Evaluation

## 2013-03-30 NOTE — Plan of Care (Addendum)
Problem: Phase I Progression Outcomes Goal: Tubes/drains patent Outcome: Not Met (add Reason) 1845- had to flush L PCN due to clots blocking tube with increase in flow noted for few seconds. Paged PA on call to make aware. She said it was OK to flush if need be. Monitor UOP from foley and pain unrelieved by prns. Any problems call Dr Annabell Howells tonight

## 2013-03-30 NOTE — Procedures (Signed)
Left perc nephroureteral 2f No complication No blood loss. See complete dictation in [facility].

## 2013-03-30 NOTE — Anesthesia Postprocedure Evaluation (Signed)
Anesthesia Post Note  Patient: Brett Hamilton  Procedure(s) Performed: Procedure(s) (LRB): NEPHROLITHOTOMY PERCUTANEOUS (Left)  Anesthesia type: General  Patient location: PACU  Post pain: Pain level controlled  Post assessment: Post-op Vital signs reviewed  Last Vitals: BP 143/95  Pulse 64  Temp(Src) 36.4 C  Resp 14  Ht 5\' 11"  (1.803 m)  Wt 279 lb 15.8 oz (127 kg)  BMI 39.07 kg/m2  SpO2 96%  Post vital signs: Reviewed  Level of consciousness: sedated  Complications: No apparent anesthesia complications

## 2013-03-30 NOTE — H&P (Signed)
Brett Hamilton is an 37 y.o. male.   Chief Complaint: left kidney stone HPI: Patient with history of large left renal stone presents today for left percutaneous nephrostomy followed by nephrolithotomy.  Past Medical History  Diagnosis Date  . Hypertension   . Arthritis   . History of kidney stones     Past Surgical History  Procedure Laterality Date  . Cholecystectomy      Family History  Problem Relation Age of Onset  . Hypertension Mother    Social History:  reports that he has never smoked. He does not have any smokeless tobacco history on file. He reports that he does not drink alcohol or use illicit drugs.  Allergies: No Known Allergies  No current facility-administered medications for this encounter. No current outpatient prescriptions on file. Facility-Administered Medications Ordered in Other Encounters: 0.9 %  sodium chloride infusion, , Intravenous, Once, Robet Leu, PA-C;  ceFAZolin (ANCEF) 3 g in dextrose 5 % 50 mL IVPB, 3 g, Intravenous, 30 min Pre-Op, Valetta Fuller, MD;  ciprofloxacin (CIPRO) IVPB 400 mg, 400 mg, Intravenous, Once, Robet Leu, PA-C    Results for orders placed during the hospital encounter of 03/30/13  APTT      Result Value Range   aPTT 25  24 - 37 seconds  CBC      Result Value Range   WBC 22.0 (*) 4.0 - 10.5 K/uL   RBC 5.65  4.22 - 5.81 MIL/uL   Hemoglobin 16.2  13.0 - 17.0 g/dL   HCT 47.8  29.5 - 62.1 %   MCV 81.2  78.0 - 100.0 fL   MCH 28.7  26.0 - 34.0 pg   MCHC 35.3  30.0 - 36.0 g/dL   RDW 30.8  65.7 - 84.6 %   Platelets 312  150 - 400 K/uL  PROTIME-INR      Result Value Range   Prothrombin Time 13.2  11.6 - 15.2 seconds   INR 1.01  0.00 - 1.49     Review of Systems  Constitutional: Negative for fever and chills.  Respiratory: Negative for cough and shortness of breath.   Cardiovascular: Negative for chest pain.  Gastrointestinal: Negative for nausea, vomiting and abdominal pain.  Genitourinary: Negative for  dysuria and flank pain.  Musculoskeletal: Negative for back pain.  Neurological: Negative for headaches.  Endo/Heme/Allergies: Does not bruise/bleed easily.   Vitals: BP 160/98  HR 65  R 20 TEMP 98.7  O2 SATS 97% RA  Physical Exam  Constitutional: He is oriented to person, place, and time. He appears well-developed and well-nourished.  Cardiovascular: Normal rate and regular rhythm.   Respiratory: Effort normal and breath sounds normal.  GI: Soft. Bowel sounds are normal. There is no tenderness.  Musculoskeletal: Normal range of motion. He exhibits no edema.  Neurological: He is alert and oriented to person, place, and time.     Assessment/Plan Pt with large left renal stone. Plan is for left percutaneous nephrostomy today followed by nephrolithotomy. Details/risks of procedure d/w pt /wife with their understanding and consent.  Durrell Barajas,D KEVIN 03/30/2013, 8:11 AM

## 2013-03-31 ENCOUNTER — Encounter (HOSPITAL_COMMUNITY): Payer: Self-pay | Admitting: Urology

## 2013-03-31 ENCOUNTER — Observation Stay (HOSPITAL_COMMUNITY)
Admission: EM | Admit: 2013-03-31 | Discharge: 2013-04-02 | Disposition: A | Discharge status: Home / Self Care | Payer: 59 | Attending: Urology | Admitting: Urology

## 2013-03-31 ENCOUNTER — Observation Stay (HOSPITAL_COMMUNITY): Payer: 59

## 2013-03-31 DIAGNOSIS — Z9889 Other specified postprocedural states: Secondary | ICD-10-CM | POA: Insufficient documentation

## 2013-03-31 DIAGNOSIS — R109 Unspecified abdominal pain: Principal | ICD-10-CM | POA: Insufficient documentation

## 2013-03-31 DIAGNOSIS — Z436 Encounter for attention to other artificial openings of urinary tract: Secondary | ICD-10-CM | POA: Insufficient documentation

## 2013-03-31 LAB — COMPREHENSIVE METABOLIC PANEL
ALT: 30 U/L (ref 0–53)
CO2: 21 mEq/L (ref 19–32)
Calcium: 9 mg/dL (ref 8.4–10.5)
Creatinine, Ser: 1.06 mg/dL (ref 0.50–1.35)
GFR calc Af Amer: >90 mL/min (ref >90)
GFR calc non Af Amer: 87 mL/min — ABNORMAL LOW (ref >90)
Glucose, Bld: 151 mg/dL — ABNORMAL HIGH (ref 70–99)

## 2013-03-31 LAB — CBC WITH DIFFERENTIAL/PLATELET
Basophils Relative: 0 % (ref 0–1)
Eosinophils Relative: 0 % (ref 0–5)
HCT: 45.8 % (ref 39.0–52.0)
Hemoglobin: 16.3 g/dL (ref 13.0–17.0)
Lymphs Abs: 2.8 10*3/uL (ref 0.7–4.0)
MCH: 29.1 pg (ref 26.0–34.0)
MCV: 81.6 fL (ref 78.0–100.0)
Monocytes Absolute: 3.3 10*3/uL — ABNORMAL HIGH (ref 0.1–1.0)
RBC: 5.61 MIL/uL (ref 4.22–5.81)

## 2013-03-31 LAB — BASIC METABOLIC PANEL
BUN: 21 mg/dL (ref 6–23)
CO2: 23 mEq/L (ref 19–32)
Chloride: 96 mEq/L (ref 96–112)
Creatinine, Ser: 0.98 mg/dL (ref 0.50–1.35)
GFR calc Af Amer: >90 mL/min (ref >90)

## 2013-03-31 LAB — URINALYSIS, ROUTINE W REFLEX MICROSCOPIC
Bilirubin Urine: NEGATIVE
Nitrite: NEGATIVE
Protein, ur: NEGATIVE mg/dL
Specific Gravity, Urine: 1.027 (ref 1.005–1.030)
Urobilinogen, UA: 0.2 mg/dL (ref 0.0–1.0)

## 2013-03-31 LAB — URINE MICROSCOPIC-ADD ON

## 2013-03-31 MED ORDER — CIPROFLOXACIN HCL 250 MG PO TABS: 250.0000 mg | ORAL_TABLET | Freq: Two times a day (BID) | ORAL | Status: DC

## 2013-03-31 MED ORDER — HYDROMORPHONE HCL PF 1 MG/ML IJ SOLN
1.0000 mg | Freq: Once | INTRAMUSCULAR | Status: AC
  Administered 2013-03-31: 1 mg via INTRAVENOUS
  Filled 2013-03-31: qty 1

## 2013-03-31 MED ORDER — HYDROCODONE-ACETAMINOPHEN 5-325 MG PO TABS: 1.0000 | ORAL_TABLET | Freq: Four times a day (QID) | ORAL | Status: DC | PRN

## 2013-03-31 MED ORDER — SODIUM CHLORIDE 0.9 % IV BOLUS (SEPSIS)
1000.0000 mL | Freq: Once | INTRAVENOUS | Status: AC
  Administered 2013-03-31: 1000 mL via INTRAVENOUS

## 2013-03-31 MED ORDER — ONDANSETRON HCL 4 MG/2ML IJ SOLN
4.0000 mg | Freq: Once | INTRAMUSCULAR | Status: AC
  Administered 2013-03-31: 4 mg via INTRAVENOUS
  Filled 2013-03-31: qty 2

## 2013-03-31 NOTE — Progress Notes (Signed)
Have attempted to pl;ug PCN x2 this am without success. In both cases within 10 minutes of plugging patient in severe pain. Reconnected and flushed each time with several blood clots coming out with first attempt and one long string of blood the second. Pain relieved with keeping to drainage bag. Paged MD and made aware. He to come see patient.

## 2013-03-31 NOTE — Progress Notes (Signed)
Patient given discharge instructions. Verbalized understanding. Applied leg bag to PCN and showed patient how to empty it. Also gave patient larger drainage bag for nighttime use. Prescriptions given. Given supplies for dressing care to PCN at home. Patient verbalized understanding of all instructions.

## 2013-03-31 NOTE — Care Management Note (Signed)
CARE MANAGEMENT NOTE 03/31/2013  Patient:  Brett Hamilton, Brett Hamilton   Account Number:  [id number]  Date Initiated:  03/31/2013  Documentation initiated by:  Danta Baumgardner  Subjective/Objective Assessment:   37 yo male admitted s/p nephrolithotomy. PTA pt independent.     Action/Plan:   home when stable   Anticipated DC Date:     Anticipated DC Plan:  HOME/SELF CARE      DC Planning Services  CM consult      Choice offered to / List presented to:  NA           Status of service:  Completed, signed off Medicare Important Message given?   (If response is "NO", the following Medicare IM given date fields will be blank) Date Medicare IM given:   Date Additional Medicare IM given:    Discharge Disposition:    Per UR Regulation:  Reviewed for med. necessity/level of care/duration of stay  If discussed at Long Length of Stay Meetings, dates discussed:    Comments:  03/31/13 1349 Nisreen Guise,RN,BSN [phone]

## 2013-03-31 NOTE — Discharge Summary (Signed)
Physician Discharge Summary  Patient ID: CURTEZ BRALLIER MRN: [id number] DOB/AGE: 1976-11-12 37 y.o.  Admit date: 03/30/2013 Discharge date: 03/31/2013  Admission Diagnoses:  Discharge Diagnoses:  Active Problems:   * No active hospital problems. *   Discharged Condition: good  Hospital Course: Patient underwent a percutaneous nephrolithotomy for a 2 cm stone in the left renal pelvis. Procedure itself was uneventful and went well. The patient remained stable during the postoperative time frame. Hemoglobin was 15.8 postoperatively. Renal function remained normal. The patient had good urinary output. CT scan the following day showed no significant residual stone fragments other than a possible 3 mm fragment. No evidence of any complication. Foley catheter was removed and the patient was able to void well. Nephrostomy tube continued to put out light pink urine. We attempted the plug as nephrostomy tube on 2 occasions but he did have increased flank pain and therefore decision was made to send him home with nephrostomy tube drainage probably due to some mild edema at the ureteropelvic junction.  Consults: None  Significant Diagnostic Studies: CT scan as described above.  Treatments: surgery: Placement of a left nephrostomy tube by interventional radiology followed by left percutaneous nephrolithotomy in the OR.  Discharge Exam: Blood pressure 121/62, pulse 62, temperature 98 F (36.7 C), temperature source Oral, resp. rate 18, height 5\' 11"  (1.803 m), weight 127 kg (279 lb 15.8 oz), SpO2 97.00%. Multiple well-nourished male in no acute distress. Respiratory effort is normal. Cardiac exam showed regular rate and rhythm. Abdomen soft and nontender. Nephrostomy site okay. Extremities without edema or tenderness.  Disposition: 01-Home or Self Care     Medication List    TAKE these medications       amLODipine 10 MG tablet  Commonly known as:  NORVASC  Take 10 mg by mouth daily.     ciprofloxacin 250 MG tablet  Commonly known as:  CIPRO  Take 1 tablet (250 mg total) by mouth 2 (two) times daily.     HYDROcodone-acetaminophen 5-325 MG per tablet  Commonly known as:  NORCO/VICODIN  Take 1-2 tablets by mouth every 6 (six) hours as needed for pain.     lisinopril-hydrochlorothiazide 20-12.5 MG per tablet  Commonly known as:  PRINZIDE,ZESTORETIC  Take 1 tablet by mouth daily.     meloxicam 15 MG tablet  Commonly known as:  MOBIC  Take 15 mg by mouth daily.     ofloxacin 0.3 % ophthalmic solution  Commonly known as:  OCUFLOX  Use 1-2 dorps every 3 hours for 2 days, then 4 times daily     ondansetron 4 MG disintegrating tablet  Commonly known as:  ZOFRAN ODT  Take 1 tablet (4 mg total) by mouth every 8 (eight) hours as needed for nausea.     oxyCODONE-acetaminophen 5-325 MG per tablet  Commonly known as:  PERCOCET/ROXICET  Take one or 2 tablets every 4 hours as needed for pain     traMADol 50 MG tablet  Commonly known as:  ULTRAM  Take 50 mg by mouth every 6 (six) hours as needed for pain (2 tabs 3 times daily).           Follow-up Information   Please follow up. (we will call for thursday appointment)       Signed: Barron Alvine S 03/31/2013, 12:32 PM

## 2013-03-31 NOTE — Care Management (Signed)
Cm spoke with patient concerning discharge planning. Per pt independent PTA, lives home with spouse, no needs requested or assessed.    Roxy Manns Yamel Bale,RN,BSN [phone]

## 2013-03-31 NOTE — ED Notes (Signed)
Pt reports recent admission for kidney stone. Pt had surgery and has nephrostomy drain to left flank. Pt c/o severe left flank pain onset after discharge from hospital. Reports taking 2-norco without relief in pain.

## 2013-03-31 NOTE — Progress Notes (Signed)
Patient ID: Brett Hamilton, male   DOB: 02/02/1976, 37 y.o.   MRN: [id number] 1 Day Post-Op Subjective: Patient reports doing well with minimal pain. NT draining light pink urine.  Objective: Vital signs in last 24 hours: Temp:  [97.6 F (36.4 C)-98.7 F (37.1 C)] 98.7 F (37.1 C) (05/06 0543) Pulse Rate:  [58-78] 59 (05/06 0543) Resp:  [9-20] 16 (05/06 0543) BP: (127-189)/(74-119) 127/79 mmHg (05/06 0543) SpO2:  [96 %-100 %] 96 % (05/06 0543) Weight:  [127 kg (279 lb 15.8 oz)] 127 kg (279 lb 15.8 oz) (05/05 1500)  Intake/Output from previous day: 05/05 0701 - 05/06 0700 In: 2070 [P.O.:360; I.V.:1650] Out: 3375 [Urine:3375] Intake/Output this shift:    Physical Exam:  Constitutional: Vital signs reviewed. WD WN in NAD   Cardiovascular: RRR Pulmonary/Chest: Normal effort Abdominal: Soft. Non-tender, non-distended, bowel sounds are normal, no masses, organomegaly, or guarding present.  Genitourinary:foley draining Extremities: No cyanosis or edema   Lab Results:  Recent Labs  03/30/13 0825 03/31/13 0419  HGB 16.2 15.8  HCT 45.9 45.8   BMET  Recent Labs  03/30/13 0825 03/31/13 0419  NA 134* 128*  K 4.9 4.8  CL 105 96  CO2 21 23  GLUCOSE 105* 135*  BUN 21 21  CREATININE 0.90 0.98  CALCIUM 8.8 8.8    Recent Labs  03/30/13 0825  INR 1.01   No results found for this basename: LABURIN,  in the last 72 hours Results for orders placed during the hospital encounter of 03/24/13  SURGICAL PCR SCREEN     Status: None   Collection Time    03/24/13  8:42 AM      Result Value Range Status   MRSA, PCR NEGATIVE  NEGATIVE Final   Staphylococcus aureus NEGATIVE  NEGATIVE Final   Comment:            The Xpert SA Assay (FDA     approved for NASAL specimens     in patients over 27 years of age),     is one component of     a comprehensive surveillance     program.  Test performance has     been validated by [employer] for patients greater     than or equal  to 85 year old.     It is not intended     to diagnose infection nor to     guide or monitor treatment.    Studies/Results: Ir Dil Ureter Left  03/30/2013  **ADDENDUM** CREATED: 03/30/2013 16:12:39  In the operating room assisting Dr.Alysia Scism, the antegrade nephro- ureteral catheter was exchanged over an Amplatz wire for a 9-French peel-away sheath, which allowed placement of a  second parallel safety wire.  Over the primary wire, a 10 mm balloon was advanced and used to dilate the   tissue tract to facilitate advancement of a working cannula through which a percutaneous nephrolithotomy was performed by Dr. Isabel Caprice.  At the conclusion of his procedure, the cannula was exchanged for a 18-French balloon retention Foley catheter, with the balloon slightly inflated in the renal collecting system, position confirmed on fluoroscopy with a contrast injection.  **END ADDENDUM** SIGNED BY: D. Oley Balm III, MD   03/30/2013  *RADIOLOGY REPORT*   Clinical data: Symptomatic left nephrolithiasis, preop planning  LEFT ANTEGRADE NEPHRO-URETERAL  CATHETER PLACEMENT UNDER ULTRASOUND AND FLUOROSCOPIC GUIDANCE  Comparison:  CT 02/19/2013  Technique and findings: The procedure, risks (including but not limited to bleeding, infection, organ  damage), benefits, and alternatives were explained to the patient.  Questions regarding the procedure were encouraged and answered.  The patient understands and consents to the procedure.Left flank region prepped with Betadine, draped in usual sterile fashion, infiltrated locally with 1% lidocaine.  As antibiotic prophylaxis, cipro 400 mg was ordered pre-procedure and administered intravenously within one hour of incision.   Intravenous Fentanyl and Versed were administered as conscious sedation during continuous cardiorespiratory monitoring by the radiology RN, with a total moderate sedation time of 10 minutes.   Under real-time ultrasound guidance, a 21-gauge trocar needle was advanced into a  posterior lower pole calyx. Ultrasound image documentation was saved. Urine spontaneously returned through the needle. Needle was exchanged over a guidewire for transitional dilator. Contrast injection confirmed appropriate positioning. Catheter was exchanged over a guidewire for a a 5-French Kumpe catheter, advanced past the UPJ calculus and into the urinary bladder.    Catheter   secured externally and capped.  No immediate complication.  Fluoroscopy time: 4 minutes 18seconds  IMPRESSION Technically successful antegrade left percutaneous nephro-ureteral catheter placement.  Original Report Authenticated By: D. Andria Rhein, MD    Ir US Guide Bx Asp/drain  03/30/2013  **ADDENDUM** CREATED: 03/30/2013 16:12:39  In the operating room assisting Dr.Om Lizotte, the antegrade nephro- ureteral catheter was exchanged over an Amplatz wire for a 9-French peel-away sheath, which allowed placement of a  second parallel safety wire.  Over the primary wire, a 10 mm balloon was advanced and used to dilate the   tissue tract to facilitate advancement of a working cannula through which a percutaneous nephrolithotomy was performed by Dr. Isabel Caprice.  At the conclusion of his procedure, the cannula was exchanged for a 18-French balloon retention Foley catheter, with the balloon slightly inflated in the renal collecting system, position confirmed on fluoroscopy with a contrast injection.  **END ADDENDUM** SIGNED BY: D. Oley Balm III, MD   03/30/2013  *RADIOLOGY REPORT*   Clinical data: Symptomatic left nephrolithiasis, preop planning  LEFT ANTEGRADE NEPHRO-URETERAL  CATHETER PLACEMENT UNDER ULTRASOUND AND FLUOROSCOPIC GUIDANCE  Comparison:  CT 02/19/2013  Technique and findings: The procedure, risks (including but not limited to bleeding, infection, organ damage), benefits, and alternatives were explained to the patient.  Questions regarding the procedure were encouraged and answered.  The patient understands and consents to the  procedure.Left flank region prepped with Betadine, draped in usual sterile fashion, infiltrated locally with 1% lidocaine.  As antibiotic prophylaxis, cipro 400 mg was ordered pre-procedure and administered intravenously within one hour of incision.   Intravenous Fentanyl and Versed were administered as conscious sedation during continuous cardiorespiratory monitoring by the radiology RN, with a total moderate sedation time of 10 minutes.   Under real-time ultrasound guidance, a 21-gauge trocar needle was advanced into a posterior lower pole calyx. Ultrasound image documentation was saved. Urine spontaneously returned through the needle. Needle was exchanged over a guidewire for transitional dilator. Contrast injection confirmed appropriate positioning. Catheter was exchanged over a guidewire for a a 5-French Kumpe catheter, advanced past the UPJ calculus and into the urinary bladder.    Catheter   secured externally and capped.  No immediate complication.  Fluoroscopy time: 4 minutes 18seconds  IMPRESSION Technically successful antegrade left percutaneous nephro-ureteral catheter placement.  Original Report Authenticated By: D. Andria Rhein, MD    Dg C-arm 1-60 Min-no Report  03/30/2013  CLINICAL DATA: left stone   C-ARM 1-60 MINUTES  Fluoroscopy was utilized by the requesting physician.  No radiographic  interpretation.  Ir [place] Abts Cath Perc Left  03/30/2013  **ADDENDUM** CREATED: 03/30/2013 16:12:39  In the operating room assisting Dr.Tommy Minichiello, the antegrade nephro- ureteral catheter was exchanged over an Amplatz wire for a 9-French peel-away sheath, which allowed placement of a  second parallel safety wire.  Over the primary wire, a 10 mm balloon was advanced and used to dilate the   tissue tract to facilitate advancement of a working cannula through which a percutaneous nephrolithotomy was performed by Dr. Isabel Caprice.  At the conclusion of his procedure, the cannula was exchanged for a 18-French balloon  retention Foley catheter, with the balloon slightly inflated in the renal collecting system, position confirmed on fluoroscopy with a contrast injection.  **END ADDENDUM** SIGNED BY: D. Oley Balm III, MD   03/30/2013  *RADIOLOGY REPORT*   Clinical data: Symptomatic left nephrolithiasis, preop planning  LEFT ANTEGRADE NEPHRO-URETERAL  CATHETER PLACEMENT UNDER ULTRASOUND AND FLUOROSCOPIC GUIDANCE  Comparison:  CT 02/19/2013  Technique and findings: The procedure, risks (including but not limited to bleeding, infection, organ damage), benefits, and alternatives were explained to the patient.  Questions regarding the procedure were encouraged and answered.  The patient understands and consents to the procedure.Left flank region prepped with Betadine, draped in usual sterile fashion, infiltrated locally with 1% lidocaine.  As antibiotic prophylaxis, cipro 400 mg was ordered pre-procedure and administered intravenously within one hour of incision.   Intravenous Fentanyl and Versed were administered as conscious sedation during continuous cardiorespiratory monitoring by the radiology RN, with a total moderate sedation time of 10 minutes.   Under real-time ultrasound guidance, a 21-gauge trocar needle was advanced into a posterior lower pole calyx. Ultrasound image documentation was saved. Urine spontaneously returned through the needle. Needle was exchanged over a guidewire for transitional dilator. Contrast injection confirmed appropriate positioning. Catheter was exchanged over a guidewire for a a 5-French Kumpe catheter, advanced past the UPJ calculus and into the urinary bladder.    Catheter   secured externally and capped.  No immediate complication.  Fluoroscopy time: 4 minutes 18seconds  IMPRESSION Technically successful antegrade left percutaneous nephro-ureteral catheter placement.  Original Report Authenticated By: D. Andria Rhein, MD     Assessment/Plan:   Doing well. CT scan this Am. Will plug NT and  probably remove if CT ok. Probable D/Cm home later tioday   LOS: 1 day   Aarik Blank S 03/31/2013, 7:32 AM

## 2013-03-31 NOTE — ED Provider Notes (Signed)
History     CSN: [id number]  Arrival date & time 03/31/13  2122   First MD Initiated Contact with Patient 03/31/13 2139      Chief Complaint  Patient presents with  . Flank Pain    (Consider location/radiation/quality/duration/timing/severity/associated sxs/prior treatment) HPI  The patient presents on the day of discharge from a urology procedure with increasing pain in his left upper abdomen, left flank.  He states that since discharge she has continued to have pain, worse over the past 2 hours.  The pain is focal, minimally radiating, sharp, worse with motion or palpation. Pain is minimally relieved with  Past Medical History  Diagnosis Date  . Hypertension   . Arthritis   . History of kidney stones     Past Surgical History  Procedure Laterality Date  . Cholecystectomy    . Nephrolithotomy Left 03/30/2013    Procedure: NEPHROLITHOTOMY PERCUTANEOUS;  Surgeon: Valetta Fuller, MD;  Location: [facility];  Service: Urology;  Laterality: Left;    Family History  Problem Relation Age of Onset  . Hypertension Mother     History  Substance Use Topics  . Smoking status: Never Smoker   . Smokeless tobacco: Not on file  . Alcohol Use: No      Review of Systems  Constitutional: Positive for fever and chills.  HENT: Negative.   Respiratory: Negative.   Cardiovascular: Negative.   Gastrointestinal: Positive for abdominal pain. Negative for nausea, vomiting and diarrhea.  Genitourinary: Positive for flank pain. Negative for dysuria.  Musculoskeletal: Negative.   Skin: Negative for rash.  Neurological: Negative for weakness.    Allergies  Review of patient's allergies indicates no known allergies.  Home Medications   Current Outpatient Rx  Name  Route  Sig  Dispense  Refill  . amLODipine (NORVASC) 10 MG tablet   Oral   Take 10 mg by mouth daily.         . ciprofloxacin (CIPRO) 250 MG tablet   Oral   Take 1 tablet (250 mg total) by mouth 2 (two) times daily.  10 tablet   0   . HYDROcodone-acetaminophen (NORCO/VICODIN) 5-325 MG per tablet   Oral   Take 1-2 tablets by mouth every 6 (six) hours as needed for pain.   20 tablet   0   . lisinopril-hydrochlorothiazide (PRINZIDE,ZESTORETIC) 20-12.5 MG per tablet   Oral   Take 1 tablet by mouth daily.         . meloxicam (MOBIC) 15 MG tablet   Oral   Take 15 mg by mouth daily.         Marland Kitchen ofloxacin (OCUFLOX) 0.3 % ophthalmic solution      Use 1-2 dorps every 3 hours for 2 days, then 4 times daily   5 mL   1   . ondansetron (ZOFRAN ODT) 4 MG disintegrating tablet   Oral   Take 1 tablet (4 mg total) by mouth every 8 (eight) hours as needed for nausea.   10 tablet   0   . oxyCODONE-acetaminophen (PERCOCET/ROXICET) 5-325 MG per tablet      Take one or 2 tablets every 4 hours as needed for pain   20 tablet   0   . traMADol (ULTRAM) 50 MG tablet   Oral   Take 50 mg by mouth every 6 (six) hours as needed for pain (2 tabs 3 times daily).           BP 156/109  Pulse 99  Temp(Src)  99 F (37.2 C) (Oral)  Resp 24  SpO2 97%  Physical Exam  Nursing note and vitals reviewed. Constitutional: He is oriented to person, place, and time. He appears well-developed. No distress.  Uncomfortable appearing large male  HENT:  Head: Normocephalic and atraumatic.  Eyes: Conjunctivae and EOM are normal.  Cardiovascular: Normal rate and regular rhythm.   Pulmonary/Chest: Effort normal. No stridor. No respiratory distress.  Abdominal: He exhibits no distension. There is tenderness in the left upper quadrant. There is no rigidity and no rebound.  Beyond the left upper quadrant, left flank, the abdomen is otherwise soft, non-peritoneal.  Musculoskeletal: He exhibits no edema.       Arms: Neurological: He is alert and oriented to person, place, and time.  Skin: Skin is warm. He is diaphoretic.  Psychiatric: He has a normal mood and affect.    ED Course  Procedures (including critical care  time)  Following the initial evaluation the patient I reviewed his recent hospitalization, including procedure, CT scan from today. I subsequently spoke with the patient's urologist about him.  We agreed that if the patient requires admission for pain control, this is reasonable.  Labs Reviewed  CBC WITH DIFFERENTIAL - Abnormal; Notable for the following:    WBC 27.8 (*)    All other components within normal limits  COMPREHENSIVE METABOLIC PANEL - Abnormal; Notable for the following:    Sodium 130 (*)    Glucose, Bld 151 (*)    BUN 24 (*)    Albumin 3.3 (*)    GFR calc non Af Amer 87 (*)    All other components within normal limits  URINALYSIS, ROUTINE W REFLEX MICROSCOPIC   Ct Abdomen Pelvis Wo Contrast  03/31/2013  *RADIOLOGY REPORT*  Clinical Data: Postop nephrostomy tube  CT ABDOMEN AND PELVIS WITHOUT CONTRAST  Technique:  Multidetector CT imaging of the abdomen and pelvis was performed following the standard protocol without intravenous contrast.  Comparison: 02/19/2013  Findings: There is atelectasis noted within both lung bases.  No pleural or pericardial effusion identified.  There is no focal liver abnormality.  Prior cholecystectomy.  No biliary dilatation.  Normal appearance of the pancreas.  The spleen appears normal.  The left adrenal gland is normal.  There is a nodule in the right adrenal gland which measures 2.2 cm and 11 HU, image 24/series 2. This is stable from previous exam. Punctate stone is identified within the right kidney measuring 3 mm.  Percutaneous nephrostomy has been performed on the left. There is gas within the left renal pelvis/calyces and proximal ureter likely reflecting recent instrumentation.  Resolution of previous left hydronephrosis noted.  The left renal stone is no longer present. A tiny a single tiny stone fragment remains within the left renal pelvis measuring 30 the 4 mm.  Cysts are identified within the upper pole the left kidney.  These are  incompletely characterized without IV contrast.  Small amount of gas is noted within the otherwise normal appearing bladder.  Prostate gland and seminal vesicles are unremarkable.  Normal caliber of the abdominal aorta.  No aneurysm.  No upper abdominal adenopathy.  No pelvic or inguinal adenopathy.  No free fluid or abnormal fluid collections identified.  The stomach is normal.  The small bowel loops have a normal course and caliber.  No obstruction.  The appendix is visualized and appears normal.  Normal appearance of the colon.  Review of the visualized bony structures is unremarkable.  IMPRESSION:  1.  Status post  left-sided percutaneous nephrostomy. 2.  There may be a tiny stone within the left renal pelvis measuring approximately 3-4 mm.  No other renal calculi identified.   Original Report Authenticated By: Signa Kell, M.D.    Ir Dil Ureter Left  03/30/2013  **ADDENDUM** CREATED: 03/30/2013 16:12:39  In the operating room assisting Dr.Grapey, the antegrade nephro- ureteral catheter was exchanged over an Amplatz wire for a 9-French peel-away sheath, which allowed placement of a  second parallel safety wire.  Over the primary wire, a 10 mm balloon was advanced and used to dilate the   tissue tract to facilitate advancement of a working cannula through which a percutaneous nephrolithotomy was performed by Dr. Isabel Caprice.  At the conclusion of his procedure, the cannula was exchanged for a 18-French balloon retention Foley catheter, with the balloon slightly inflated in the renal collecting system, position confirmed on fluoroscopy with a contrast injection.  **END ADDENDUM** SIGNED BY: D. Oley Balm III, MD   03/30/2013  *RADIOLOGY REPORT*   Clinical data: Symptomatic left nephrolithiasis, preop planning  LEFT ANTEGRADE NEPHRO-URETERAL  CATHETER PLACEMENT UNDER ULTRASOUND AND FLUOROSCOPIC GUIDANCE  Comparison:  CT 02/19/2013  Technique and findings: The procedure, risks (including but not limited to bleeding,  infection, organ damage), benefits, and alternatives were explained to the patient.  Questions regarding the procedure were encouraged and answered.  The patient understands and consents to the procedure.Left flank region prepped with Betadine, draped in usual sterile fashion, infiltrated locally with 1% lidocaine.  As antibiotic prophylaxis, cipro 400 mg was ordered pre-procedure and administered intravenously within one hour of incision.   Intravenous Fentanyl and Versed were administered as conscious sedation during continuous cardiorespiratory monitoring by the radiology RN, with a total moderate sedation time of 10 minutes.   Under real-time ultrasound guidance, a 21-gauge trocar needle was advanced into a posterior lower pole calyx. Ultrasound image documentation was saved. Urine spontaneously returned through the needle. Needle was exchanged over a guidewire for transitional dilator. Contrast injection confirmed appropriate positioning. Catheter was exchanged over a guidewire for a a 5-French Kumpe catheter, advanced past the UPJ calculus and into the urinary bladder.    Catheter   secured externally and capped.  No immediate complication.  Fluoroscopy time: 4 minutes 18seconds  IMPRESSION Technically successful antegrade left percutaneous nephro-ureteral catheter placement.  Original Report Authenticated By: D. Andria Rhein, MD    Ir US Guide Bx Asp/drain  03/30/2013  **ADDENDUM** CREATED: 03/30/2013 16:12:39  In the operating room assisting Dr.Grapey, the antegrade nephro- ureteral catheter was exchanged over an Amplatz wire for a 9-French peel-away sheath, which allowed placement of a  second parallel safety wire.  Over the primary wire, a 10 mm balloon was advanced and used to dilate the   tissue tract to facilitate advancement of a working cannula through which a percutaneous nephrolithotomy was performed by Dr. Isabel Caprice.  At the conclusion of his procedure, the cannula was exchanged for a 18-French balloon  retention Foley catheter, with the balloon slightly inflated in the renal collecting system, position confirmed on fluoroscopy with a contrast injection.  **END ADDENDUM** SIGNED BY: D. Oley Balm III, MD   03/30/2013  *RADIOLOGY REPORT*   Clinical data: Symptomatic left nephrolithiasis, preop planning  LEFT ANTEGRADE NEPHRO-URETERAL  CATHETER PLACEMENT UNDER ULTRASOUND AND FLUOROSCOPIC GUIDANCE  Comparison:  CT 02/19/2013  Technique and findings: The procedure, risks (including but not limited to bleeding, infection, organ damage), benefits, and alternatives were explained to the patient.  Questions regarding the procedure were encouraged  and answered.  The patient understands and consents to the procedure.Left flank region prepped with Betadine, draped in usual sterile fashion, infiltrated locally with 1% lidocaine.  As antibiotic prophylaxis, cipro 400 mg was ordered pre-procedure and administered intravenously within one hour of incision.   Intravenous Fentanyl and Versed were administered as conscious sedation during continuous cardiorespiratory monitoring by the radiology RN, with a total moderate sedation time of 10 minutes.   Under real-time ultrasound guidance, a 21-gauge trocar needle was advanced into a posterior lower pole calyx. Ultrasound image documentation was saved. Urine spontaneously returned through the needle. Needle was exchanged over a guidewire for transitional dilator. Contrast injection confirmed appropriate positioning. Catheter was exchanged over a guidewire for a a 5-French Kumpe catheter, advanced past the UPJ calculus and into the urinary bladder.    Catheter   secured externally and capped.  No immediate complication.  Fluoroscopy time: 4 minutes 18seconds  IMPRESSION Technically successful antegrade left percutaneous nephro-ureteral catheter placement.  Original Report Authenticated By: D. Andria Rhein, MD    Dg C-arm 1-60 Min-no Report  03/30/2013  CLINICAL DATA: left stone    C-ARM 1-60 MINUTES  Fluoroscopy was utilized by the requesting physician.  No radiographic  interpretation.     Ir [place] Abts Cath Perc Left  03/30/2013  **ADDENDUM** CREATED: 03/30/2013 16:12:39  In the operating room assisting Dr.Grapey, the antegrade nephro- ureteral catheter was exchanged over an Amplatz wire for a 9-French peel-away sheath, which allowed placement of a  second parallel safety wire.  Over the primary wire, a 10 mm balloon was advanced and used to dilate the   tissue tract to facilitate advancement of a working cannula through which a percutaneous nephrolithotomy was performed by Dr. Isabel Caprice.  At the conclusion of his procedure, the cannula was exchanged for a 18-French balloon retention Foley catheter, with the balloon slightly inflated in the renal collecting system, position confirmed on fluoroscopy with a contrast injection.  **END ADDENDUM** SIGNED BY: D. Oley Balm III, MD   03/30/2013  *RADIOLOGY REPORT*   Clinical data: Symptomatic left nephrolithiasis, preop planning  LEFT ANTEGRADE NEPHRO-URETERAL  CATHETER PLACEMENT UNDER ULTRASOUND AND FLUOROSCOPIC GUIDANCE  Comparison:  CT 02/19/2013  Technique and findings: The procedure, risks (including but not limited to bleeding, infection, organ damage), benefits, and alternatives were explained to the patient.  Questions regarding the procedure were encouraged and answered.  The patient understands and consents to the procedure.Left flank region prepped with Betadine, draped in usual sterile fashion, infiltrated locally with 1% lidocaine.  As antibiotic prophylaxis, cipro 400 mg was ordered pre-procedure and administered intravenously within one hour of incision.   Intravenous Fentanyl and Versed were administered as conscious sedation during continuous cardiorespiratory monitoring by the radiology RN, with a total moderate sedation time of 10 minutes.   Under real-time ultrasound guidance, a 21-gauge trocar needle was advanced into a  posterior lower pole calyx. Ultrasound image documentation was saved. Urine spontaneously returned through the needle. Needle was exchanged over a guidewire for transitional dilator. Contrast injection confirmed appropriate positioning. Catheter was exchanged over a guidewire for a a 5-French Kumpe catheter, advanced past the UPJ calculus and into the urinary bladder.    Catheter   secured externally and capped.  No immediate complication.  Fluoroscopy time: 4 minutes 18seconds  IMPRESSION Technically successful antegrade left percutaneous nephro-ureteral catheter placement.  Original Report Authenticated By: D. Andria Rhein, MD      No diagnosis found.  11:26 PM Patient continues to c/o pain.  No new n/v, fever.  MDM  This patient presents after a recent urologic procedure with pain in his left upper abdomen, left flank.  He is afebrile, awake, alert, appropriately interactive, hemodynamically stable.  There continues to be discharge (red/yellow) from his percutaneous nephrostomy tube.  Per the patient's increasing leukocytosis, this is likely reactive, given the absence of fever, episodes of peritonitis, the absence of distress, though he appears uncomfortable.  With recent CT scan, additional imaging was not performed, but the patient was admitted for further evaluation and management of his persistent pain.     Gerhard Munch, MD 03/31/13 2332

## 2013-04-01 ENCOUNTER — Observation Stay (HOSPITAL_COMMUNITY): Payer: 59

## 2013-04-01 MED ORDER — HYDROMORPHONE HCL PF 1 MG/ML IJ SOLN: 1.0000 mg | INTRAMUSCULAR | Status: AC | PRN

## 2013-04-01 MED ORDER — HYDROCODONE-ACETAMINOPHEN 5-325 MG PO TABS
1.0000 | ORAL_TABLET | ORAL | Status: DC | PRN
  Administered 2013-04-01 (×2): 2 via ORAL
  Administered 2013-04-01: 1 via ORAL
  Administered 2013-04-02 (×2): 2 via ORAL
  Filled 2013-04-01 (×5): qty 2

## 2013-04-01 MED ORDER — HYDROMORPHONE HCL PF 1 MG/ML IJ SOLN
1.0000 mg | INTRAMUSCULAR | Status: DC | PRN
  Administered 2013-04-01 (×3): 1 mg via INTRAVENOUS
  Filled 2013-04-01 (×3): qty 1

## 2013-04-01 MED ORDER — HYDROMORPHONE HCL PF 1 MG/ML IJ SOLN
1.0000 mg | Freq: Once | INTRAMUSCULAR | Status: AC
  Administered 2013-04-01: 1 mg via INTRAVENOUS
  Filled 2013-04-01: qty 1

## 2013-04-01 MED ORDER — CIPROFLOXACIN HCL 250 MG PO TABS
250.0000 mg | ORAL_TABLET | Freq: Two times a day (BID) | ORAL | Status: DC
  Administered 2013-04-01 – 2013-04-02 (×3): 250 mg via ORAL
  Filled 2013-04-01 (×7): qty 1

## 2013-04-01 MED ORDER — ONDANSETRON HCL 4 MG/2ML IJ SOLN
4.0000 mg | Freq: Three times a day (TID) | INTRAMUSCULAR | Status: AC | PRN
  Administered 2013-04-01: 4 mg via INTRAVENOUS
  Filled 2013-04-01: qty 2

## 2013-04-01 MED ORDER — SODIUM CHLORIDE 0.9 % IV SOLN
INTRAVENOUS | Status: AC
  Administered 2013-04-01 (×2): via INTRAVENOUS

## 2013-04-01 NOTE — H&P (Signed)
Brett Hamilton is an 37 y.o. male.   Chief Complaint: Recurrent flank/abdominal pain HPI: Patient is status post uneventful percutaneous nephrolithotomy for 2 cm stone in his renal pelvis. He was monitored overnight in the next morning CT scan revealed no significant residual fragments no evidence of hematoma or urinoma and the patient was doing well clinically. He did have some increase flank discomfort with attempt to plug his nephrostomy tube and therefore we elected to send him home on postoperative day 1 with the nephrostomy tube to straight drainage. The urine continued to be clearing he had no voiding difficulties. Yesterday evening the patient experienced increasing discomfort and went back to the emergency room where he was admitted primarily for pain control. This morning he feels better with just some mild soreness. I did attempt however to plug his nephrostomy tube and he developed recurrent increase flank pain within about 10 minutes. He does have an elevated white blood cell count but is been afebrile and has shown no signs of hemodynamic instability or urosepsis. Hemoglobin level is stable and renal function has been normal.  Past Medical History  Diagnosis Date  . Hypertension   . Arthritis   . History of kidney stones     Past Surgical History  Procedure Laterality Date  . Cholecystectomy    . Nephrolithotomy Left 03/30/2013    Procedure: NEPHROLITHOTOMY PERCUTANEOUS;  Surgeon: Valetta Fuller, MD;  Location: [facility];  Service: Urology;  Laterality: Left;    Family History  Problem Relation Age of Onset  . Hypertension Mother    Social History:  reports that he has never smoked. He does not have any smokeless tobacco history on file. He reports that he does not drink alcohol or use illicit drugs.  Allergies: No Known Allergies  Medications Prior to Admission  Medication Sig Dispense Refill  . amLODipine (NORVASC) 10 MG tablet Take 10 mg by mouth daily.      . ciprofloxacin  (CIPRO) 250 MG tablet Take 1 tablet (250 mg total) by mouth 2 (two) times daily.  10 tablet  0  . HYDROcodone-acetaminophen (NORCO/VICODIN) 5-325 MG per tablet Take 1-2 tablets by mouth every 6 (six) hours as needed for pain.  20 tablet  0  . lisinopril-hydrochlorothiazide (PRINZIDE,ZESTORETIC) 20-12.5 MG per tablet Take 1 tablet by mouth daily.      . meloxicam (MOBIC) 15 MG tablet Take 15 mg by mouth daily.      Marland Kitchen ofloxacin (OCUFLOX) 0.3 % ophthalmic solution Use 1-2 dorps every 3 hours for 2 days, then 4 times daily  5 mL  1  . ondansetron (ZOFRAN ODT) 4 MG disintegrating tablet Take 1 tablet (4 mg total) by mouth every 8 (eight) hours as needed for nausea.  10 tablet  0  . oxyCODONE-acetaminophen (PERCOCET/ROXICET) 5-325 MG per tablet Take one or 2 tablets every 4 hours as needed for pain  20 tablet  0  . traMADol (ULTRAM) 50 MG tablet Take 50 mg by mouth every 6 (six) hours as needed for pain (2 tabs 3 times daily).        Results for orders placed during the hospital encounter of 03/31/13 (from the past 48 hour(s))  CBC WITH DIFFERENTIAL     Status: Abnormal   Collection Time    03/31/13  9:48 PM      Result Value Range   WBC 27.8 (*) 4.0 - 10.5 K/uL   Comment: CONSISTENT WITH PREVIOUS RESULT   RBC 5.61  4.22 - 5.81  MIL/uL   Hemoglobin 16.3  13.0 - 17.0 g/dL   HCT 16.1  09.6 - 04.5 %   MCV 81.6  78.0 - 100.0 fL   MCH 29.1  26.0 - 34.0 pg   MCHC 35.6  30.0 - 36.0 g/dL   RDW 40.9  81.1 - 91.4 %   Platelets 283  150 - 400 K/uL   Neutrophils Relative 78 (*) 43 - 77 %   Lymphocytes Relative 10 (*) 12 - 46 %   Monocytes Relative 12  3 - 12 %   Eosinophils Relative 0  0 - 5 %   Basophils Relative 0  0 - 1 %   Neutro Abs 21.7 (*) 1.7 - 7.7 K/uL   Lymphs Abs 2.8  0.7 - 4.0 K/uL   Monocytes Absolute 3.3 (*) 0.1 - 1.0 K/uL   Eosinophils Absolute 0.0  0.0 - 0.7 K/uL   Basophils Absolute 0.0  0.0 - 0.1 K/uL   WBC Morphology MILD LEFT SHIFT (1-5% METAS, OCC MYELO, OCC BANDS)     COMPREHENSIVE METABOLIC PANEL     Status: Abnormal   Collection Time    03/31/13  9:48 PM      Result Value Range   Sodium 130 (*) 135 - 145 mEq/L   Potassium 4.6  3.5 - 5.1 mEq/L   Chloride 99  96 - 112 mEq/L   CO2 21  19 - 32 mEq/L   Glucose, Bld 151 (*) 70 - 99 mg/dL   BUN 24 (*) 6 - 23 mg/dL   Creatinine, Ser 7.82  0.50 - 1.35 mg/dL   Calcium 9.0  8.4 - 95.6 mg/dL   Total Protein 7.2  6.0 - 8.3 g/dL   Albumin 3.3 (*) 3.5 - 5.2 g/dL   AST 18  0 - 37 U/L   ALT 30  0 - 53 U/L   Alkaline Phosphatase 63  39 - 117 U/L   Total Bilirubin 0.3  0.3 - 1.2 mg/dL   GFR calc non Af Amer 87 (*) >90 mL/min   GFR calc Af Amer >90  >90 mL/min   Comment:            The eGFR has been calculated     using the CKD EPI equation.     This calculation has not been     validated in all clinical     situations.     eGFR's persistently     <90 mL/min signify     possible Chronic Kidney Disease.  URINALYSIS, ROUTINE W REFLEX MICROSCOPIC     Status: Abnormal   Collection Time    03/31/13 10:27 PM      Result Value Range   Color, Urine YELLOW  YELLOW   APPearance CLEAR  CLEAR   Specific Gravity, Urine 1.027  1.005 - 1.030   pH 6.0  5.0 - 8.0   Glucose, UA NEGATIVE  NEGATIVE mg/dL   Hgb urine dipstick LARGE (*) NEGATIVE   Bilirubin Urine NEGATIVE  NEGATIVE   Ketones, ur NEGATIVE  NEGATIVE mg/dL   Protein, ur NEGATIVE  NEGATIVE mg/dL   Urobilinogen, UA 0.2  0.0 - 1.0 mg/dL   Nitrite NEGATIVE  NEGATIVE   Leukocytes, UA NEGATIVE  NEGATIVE  URINE MICROSCOPIC-ADD ON     Status: None   Collection Time    03/31/13 10:27 PM      Result Value Range   WBC, UA 0-2  <3 WBC/hpf   RBC / HPF TOO NUMEROUS TO COUNT  <  3 RBC/hpf   Ct Abdomen Pelvis Wo Contrast  03/31/2013  *RADIOLOGY REPORT*  Clinical Data: Postop nephrostomy tube  CT ABDOMEN AND PELVIS WITHOUT CONTRAST  Technique:  Multidetector CT imaging of the abdomen and pelvis was performed following the standard protocol without intravenous contrast.   Comparison: 02/19/2013  Findings: There is atelectasis noted within both lung bases.  No pleural or pericardial effusion identified.  There is no focal liver abnormality.  Prior cholecystectomy.  No biliary dilatation.  Normal appearance of the pancreas.  The spleen appears normal.  The left adrenal gland is normal.  There is a nodule in the right adrenal gland which measures 2.2 cm and 11 HU, image 24/series 2. This is stable from previous exam. Punctate stone is identified within the right kidney measuring 3 mm.  Percutaneous nephrostomy has been performed on the left. There is gas within the left renal pelvis/calyces and proximal ureter likely reflecting recent instrumentation.  Resolution of previous left hydronephrosis noted.  The left renal stone is no longer present. A tiny a single tiny stone fragment remains within the left renal pelvis measuring 30 the 4 mm.  Cysts are identified within the upper pole the left kidney.  These are incompletely characterized without IV contrast.  Small amount of gas is noted within the otherwise normal appearing bladder.  Prostate gland and seminal vesicles are unremarkable.  Normal caliber of the abdominal aorta.  No aneurysm.  No upper abdominal adenopathy.  No pelvic or inguinal adenopathy.  No free fluid or abnormal fluid collections identified.  The stomach is normal.  The small bowel loops have a normal course and caliber.  No obstruction.  The appendix is visualized and appears normal.  Normal appearance of the colon.  Review of the visualized bony structures is unremarkable.  IMPRESSION:  1.  Status post left-sided percutaneous nephrostomy. 2.  There may be a tiny stone within the left renal pelvis measuring approximately 3-4 mm.  No other renal calculi identified.   Original Report Authenticated By: Signa Kell, M.D.    Ir Dil Ureter Left  03/30/2013  **ADDENDUM** CREATED: 03/30/2013 16:12:39  In the operating room assisting Dr.Cong Hightower, the antegrade nephro-  ureteral catheter was exchanged over an Amplatz wire for a 9-French peel-away sheath, which allowed placement of a  second parallel safety wire.  Over the primary wire, a 10 mm balloon was advanced and used to dilate the   tissue tract to facilitate advancement of a working cannula through which a percutaneous nephrolithotomy was performed by Dr. Isabel Caprice.  At the conclusion of his procedure, the cannula was exchanged for a 18-French balloon retention Foley catheter, with the balloon slightly inflated in the renal collecting system, position confirmed on fluoroscopy with a contrast injection.  **END ADDENDUM** SIGNED BY: D. Oley Balm III, MD   03/30/2013  *RADIOLOGY REPORT*   Clinical data: Symptomatic left nephrolithiasis, preop planning  LEFT ANTEGRADE NEPHRO-URETERAL  CATHETER PLACEMENT UNDER ULTRASOUND AND FLUOROSCOPIC GUIDANCE  Comparison:  CT 02/19/2013  Technique and findings: The procedure, risks (including but not limited to bleeding, infection, organ damage), benefits, and alternatives were explained to the patient.  Questions regarding the procedure were encouraged and answered.  The patient understands and consents to the procedure.Left flank region prepped with Betadine, draped in usual sterile fashion, infiltrated locally with 1% lidocaine.  As antibiotic prophylaxis, cipro 400 mg was ordered pre-procedure and administered intravenously within one hour of incision.   Intravenous Fentanyl and Versed were administered as conscious sedation during continuous  cardiorespiratory monitoring by the radiology RN, with a total moderate sedation time of 10 minutes.   Under real-time ultrasound guidance, a 21-gauge trocar needle was advanced into a posterior lower pole calyx. Ultrasound image documentation was saved. Urine spontaneously returned through the needle. Needle was exchanged over a guidewire for transitional dilator. Contrast injection confirmed appropriate positioning. Catheter was exchanged over a  guidewire for a a 5-French Kumpe catheter, advanced past the UPJ calculus and into the urinary bladder.    Catheter   secured externally and capped.  No immediate complication.  Fluoroscopy time: 4 minutes 18seconds  IMPRESSION Technically successful antegrade left percutaneous nephro-ureteral catheter placement.  Original Report Authenticated By: D. Andria Rhein, MD    Ir US Guide Bx Asp/drain  03/30/2013  **ADDENDUM** CREATED: 03/30/2013 16:12:39  In the operating room assisting Dr.Geneive Sandstrom, the antegrade nephro- ureteral catheter was exchanged over an Amplatz wire for a 9-French peel-away sheath, which allowed placement of a  second parallel safety wire.  Over the primary wire, a 10 mm balloon was advanced and used to dilate the   tissue tract to facilitate advancement of a working cannula through which a percutaneous nephrolithotomy was performed by Dr. Isabel Caprice.  At the conclusion of his procedure, the cannula was exchanged for a 18-French balloon retention Foley catheter, with the balloon slightly inflated in the renal collecting system, position confirmed on fluoroscopy with a contrast injection.  **END ADDENDUM** SIGNED BY: D. Oley Balm III, MD   03/30/2013  *RADIOLOGY REPORT*   Clinical data: Symptomatic left nephrolithiasis, preop planning  LEFT ANTEGRADE NEPHRO-URETERAL  CATHETER PLACEMENT UNDER ULTRASOUND AND FLUOROSCOPIC GUIDANCE  Comparison:  CT 02/19/2013  Technique and findings: The procedure, risks (including but not limited to bleeding, infection, organ damage), benefits, and alternatives were explained to the patient.  Questions regarding the procedure were encouraged and answered.  The patient understands and consents to the procedure.Left flank region prepped with Betadine, draped in usual sterile fashion, infiltrated locally with 1% lidocaine.  As antibiotic prophylaxis, cipro 400 mg was ordered pre-procedure and administered intravenously within one hour of incision.   Intravenous Fentanyl and  Versed were administered as conscious sedation during continuous cardiorespiratory monitoring by the radiology RN, with a total moderate sedation time of 10 minutes.   Under real-time ultrasound guidance, a 21-gauge trocar needle was advanced into a posterior lower pole calyx. Ultrasound image documentation was saved. Urine spontaneously returned through the needle. Needle was exchanged over a guidewire for transitional dilator. Contrast injection confirmed appropriate positioning. Catheter was exchanged over a guidewire for a a 5-French Kumpe catheter, advanced past the UPJ calculus and into the urinary bladder.    Catheter   secured externally and capped.  No immediate complication.  Fluoroscopy time: 4 minutes 18seconds  IMPRESSION Technically successful antegrade left percutaneous nephro-ureteral catheter placement.  Original Report Authenticated By: D. Andria Rhein, MD    Dg C-arm 1-60 Min-no Report  03/30/2013  CLINICAL DATA: left stone   C-ARM 1-60 MINUTES  Fluoroscopy was utilized by the requesting physician.  No radiographic  interpretation.     Ir [place] Abts Cath Perc Left  03/30/2013  **ADDENDUM** CREATED: 03/30/2013 16:12:39  In the operating room assisting Dr.Sanyla Summey, the antegrade nephro- ureteral catheter was exchanged over an Amplatz wire for a 9-French peel-away sheath, which allowed placement of a  second parallel safety wire.  Over the primary wire, a 10 mm balloon was advanced and used to dilate the   tissue tract to facilitate advancement of a working  cannula through which a percutaneous nephrolithotomy was performed by Dr. Isabel Caprice.  At the conclusion of his procedure, the cannula was exchanged for a 18-French balloon retention Foley catheter, with the balloon slightly inflated in the renal collecting system, position confirmed on fluoroscopy with a contrast injection.  **END ADDENDUM** SIGNED BY: D. Oley Balm III, MD   03/30/2013  *RADIOLOGY REPORT*   Clinical data: Symptomatic left  nephrolithiasis, preop planning  LEFT ANTEGRADE NEPHRO-URETERAL  CATHETER PLACEMENT UNDER ULTRASOUND AND FLUOROSCOPIC GUIDANCE  Comparison:  CT 02/19/2013  Technique and findings: The procedure, risks (including but not limited to bleeding, infection, organ damage), benefits, and alternatives were explained to the patient.  Questions regarding the procedure were encouraged and answered.  The patient understands and consents to the procedure.Left flank region prepped with Betadine, draped in usual sterile fashion, infiltrated locally with 1% lidocaine.  As antibiotic prophylaxis, cipro 400 mg was ordered pre-procedure and administered intravenously within one hour of incision.   Intravenous Fentanyl and Versed were administered as conscious sedation during continuous cardiorespiratory monitoring by the radiology RN, with a total moderate sedation time of 10 minutes.   Under real-time ultrasound guidance, a 21-gauge trocar needle was advanced into a posterior lower pole calyx. Ultrasound image documentation was saved. Urine spontaneously returned through the needle. Needle was exchanged over a guidewire for transitional dilator. Contrast injection confirmed appropriate positioning. Catheter was exchanged over a guidewire for a a 5-French Kumpe catheter, advanced past the UPJ calculus and into the urinary bladder.    Catheter   secured externally and capped.  No immediate complication.  Fluoroscopy time: 4 minutes 18seconds  IMPRESSION Technically successful antegrade left percutaneous nephro-ureteral catheter placement.  Original Report Authenticated By: D. Andria Rhein, MD     Review of Systems - Negative except abdominal and flank pain with some mild nausea.  Blood pressure 99/52, pulse 62, temperature 98.2 F (36.8 C), temperature source Oral, resp. rate 16, height 5\' 11"  (1.803 m), weight 124.785 kg (275 lb 1.6 oz), SpO2 92.00%. General appearance: alert, cooperative and no distress Neck: no adenopathy and  no JVD Resp: clear to auscultation bilaterally Cardio: regular rate and rhythm GI: soft, non-tender; bowel sounds normal; no masses,  no organomegaly nephrostomy tube site normal Male genitalia: normal, penis: no lesions or discharge. testes: no masses or tenderness. no hernias Pelvic: external genitalia normal Extremities: extremities normal, atraumatic, no cyanosis or edema Skin: Skin color, texture, turgor normal. No rashes or lesions Neurologic: Grossly normal  Assessment/Plan Postoperative day 2 status post percutaneous nephrolithotomy. Again the patient is readmitted primarily for pain control but seems to be fine this morning. At this point he is just having mild soreness consistent with recent surgery and nephrostomy tube. Hemoglobin is stable and there is no evidence of significant perirenal bleeding. CT scan on postoperative day 1 was totally unremarkable. Because of ongoing significant pain with plugging of his nephrostomy tube we'll ask radiology to perform a nephrostomy gram study today to see if the UPJ area is open or not. I will plan on keeping him today just to watch his situation since he did bounce back after discharge yesterday.  Izell Labat S 04/01/2013, 9:51 AM

## 2013-04-01 NOTE — Progress Notes (Signed)
After Dr. Isabel Caprice had removed bag from L nephrostomy tube pt began to complain of severe pain. By Dr. Ellin Goodie verbal order reconnected pt to bag. Will continue to monitor.

## 2013-04-01 NOTE — Care Management Note (Addendum)
    Page 1 of 1   04/02/2013     5:47:26 PM   CARE MANAGEMENT NOTE 04/02/2013  Patient:  Brett Hamilton, Brett Hamilton   Account Number:  [id number]  Date Initiated:  04/01/2013  Documentation initiated by:  Lanier Clam  Subjective/Objective Assessment:   ADMITTED W/FLANK PAIN.RECENT L PCN.     Action/Plan:   FROM HOME   Anticipated DC Date:  04/02/2013   Anticipated DC Plan:  HOME/SELF CARE      DC Planning Services  CM consult      Choice offered to / List presented to:             Status of service:  Completed, signed off Medicare Important Message given?   (If response is "NO", the following Medicare IM given date fields will be blank) Date Medicare IM given:   Date Additional Medicare IM given:    Discharge Disposition:  HOME/SELF CARE  Per UR Regulation:  Reviewed for med. necessity/level of care/duration of stay  If discussed at Long Length of Stay Meetings, dates discussed:    Comments:  04/01/13 Jams Trickett RN,BSN NCM [phone] L DRAIN.NO ANTICIPATED D/C NEEDS.

## 2013-04-02 LAB — BASIC METABOLIC PANEL
BUN: 18 mg/dL (ref 6–23)
Chloride: 102 mEq/L (ref 96–112)
GFR calc non Af Amer: >90 mL/min (ref >90)
Glucose, Bld: 92 mg/dL (ref 70–99)
Potassium: 5.1 mEq/L (ref 3.5–5.1)

## 2013-04-02 LAB — CBC
HCT: 44.7 % (ref 39.0–52.0)
Hemoglobin: 15 g/dL (ref 13.0–17.0)
MCHC: 33.6 g/dL (ref 30.0–36.0)
MCV: 83.4 fL (ref 78.0–100.0)

## 2013-04-02 NOTE — Discharge Summary (Signed)
  Addendum to previous discharge summary:  Patient was readmitted the night of discharge with increasing flank pain and inability to control pain at home. Vital signs remained normal. Hemoglobin and continue to be within normal limits and there was no evidence of any significant clinical infection or perinephric bleed. The patient subsequently had a nephrostogram study which showed patency of the collecting system with no evidence of obstruction or extravasation. The patient's pain improved dramatically. Continued to be afebrile with normal vital signs unremarkable blood work. Nephrostomy tube was removed and the patient was then discharged home.

## 2014-03-05 ENCOUNTER — Other Ambulatory Visit: Payer: Self-pay

## 2014-03-05 ENCOUNTER — Encounter (HOSPITAL_COMMUNITY): Payer: Self-pay | Admitting: Emergency Medicine

## 2014-03-05 ENCOUNTER — Observation Stay (HOSPITAL_COMMUNITY)
Admission: EM | Admit: 2014-03-05 | Discharge: 2014-03-06 | Disposition: A | Discharge status: Home / Self Care | Payer: No Typology Code available for payment source | Attending: Internal Medicine | Admitting: Internal Medicine

## 2014-03-05 ENCOUNTER — Emergency Department (HOSPITAL_COMMUNITY): Payer: No Typology Code available for payment source

## 2014-03-05 DIAGNOSIS — R0602 Shortness of breath: Secondary | ICD-10-CM | POA: Insufficient documentation

## 2014-03-05 DIAGNOSIS — J3489 Other specified disorders of nose and nasal sinuses: Secondary | ICD-10-CM | POA: Insufficient documentation

## 2014-03-05 DIAGNOSIS — Z87442 Personal history of urinary calculi: Secondary | ICD-10-CM | POA: Insufficient documentation

## 2014-03-05 DIAGNOSIS — M129 Arthropathy, unspecified: Secondary | ICD-10-CM | POA: Insufficient documentation

## 2014-03-05 DIAGNOSIS — R05 Cough: Secondary | ICD-10-CM | POA: Insufficient documentation

## 2014-03-05 DIAGNOSIS — Z79899 Other long term (current) drug therapy: Secondary | ICD-10-CM | POA: Insufficient documentation

## 2014-03-05 DIAGNOSIS — R42 Dizziness and giddiness: Secondary | ICD-10-CM | POA: Insufficient documentation

## 2014-03-05 DIAGNOSIS — R079 Chest pain, unspecified: Secondary | ICD-10-CM | POA: Diagnosis present

## 2014-03-05 DIAGNOSIS — R059 Cough, unspecified: Secondary | ICD-10-CM | POA: Insufficient documentation

## 2014-03-05 DIAGNOSIS — R000 Tachycardia, unspecified: Secondary | ICD-10-CM | POA: Insufficient documentation

## 2014-03-05 DIAGNOSIS — I10 Essential (primary) hypertension: Secondary | ICD-10-CM | POA: Insufficient documentation

## 2014-03-05 DIAGNOSIS — R0789 Other chest pain: Principal | ICD-10-CM | POA: Insufficient documentation

## 2014-03-05 LAB — I-STAT TROPONIN, ED
TROPONIN I, POC: 0 ng/mL (ref 0.00–0.08)
Troponin i, poc: 0 ng/mL (ref 0.00–0.08)

## 2014-03-05 LAB — CBC
HCT: 47.9 % (ref 39.0–52.0)
Hemoglobin: 16.6 g/dL (ref 13.0–17.0)
MCH: 29.1 pg (ref 26.0–34.0)
MCHC: 34.7 g/dL (ref 30.0–36.0)
MCV: 84 fL (ref 78.0–100.0)
PLATELETS: 233 10*3/uL (ref 150–400)
RBC: 5.7 MIL/uL (ref 4.22–5.81)
RDW: 13.2 % (ref 11.5–15.5)
WBC: 10.5 10*3/uL (ref 4.0–10.5)

## 2014-03-05 LAB — BASIC METABOLIC PANEL
BUN: 11 mg/dL (ref 6–23)
CHLORIDE: 99 meq/L (ref 96–112)
CO2: 23 mEq/L (ref 19–32)
Calcium: 9.3 mg/dL (ref 8.4–10.5)
Creatinine, Ser: 1.01 mg/dL (ref 0.50–1.35)
GFR calc non Af Amer: >90 mL/min (ref >90)
Glucose, Bld: 109 mg/dL — ABNORMAL HIGH (ref 70–99)
POTASSIUM: 4.5 meq/L (ref 3.7–5.3)
SODIUM: 137 meq/L (ref 137–147)

## 2014-03-05 LAB — RAPID URINE DRUG SCREEN, HOSP PERFORMED
Amphetamines: NOT DETECTED
BARBITURATES: NOT DETECTED
Benzodiazepines: NOT DETECTED
Cocaine: NOT DETECTED
Opiates: NOT DETECTED
Tetrahydrocannabinol: NOT DETECTED

## 2014-03-05 LAB — TROPONIN I

## 2014-03-05 LAB — ETHANOL

## 2014-03-05 MED ORDER — LISINOPRIL 20 MG PO TABS
20.0000 mg | ORAL_TABLET | Freq: Every day | ORAL | Status: DC
  Administered 2014-03-06: 20 mg via ORAL
  Filled 2014-03-05: qty 1

## 2014-03-05 MED ORDER — ASPIRIN EC 325 MG PO TBEC
325.0000 mg | DELAYED_RELEASE_TABLET | Freq: Every day | ORAL | Status: DC
  Administered 2014-03-06: 325 mg via ORAL
  Filled 2014-03-05: qty 1

## 2014-03-05 MED ORDER — MORPHINE SULFATE 2 MG/ML IJ SOLN
1.0000 mg | INTRAMUSCULAR | Status: DC | PRN
  Administered 2014-03-05: 1 mg via INTRAVENOUS
  Filled 2014-03-05: qty 1

## 2014-03-05 MED ORDER — ATORVASTATIN CALCIUM 20 MG PO TABS
20.0000 mg | ORAL_TABLET | Freq: Every day | ORAL | Status: DC
  Administered 2014-03-06: 20 mg via ORAL
  Filled 2014-03-05: qty 1

## 2014-03-05 MED ORDER — MORPHINE SULFATE 2 MG/ML IJ SOLN: 2.0000 mg | INTRAMUSCULAR | Status: DC | PRN

## 2014-03-05 MED ORDER — HYDROMORPHONE HCL PF 1 MG/ML IJ SOLN
0.5000 mg | INTRAMUSCULAR | Status: DC | PRN
  Administered 2014-03-05: 0.5 mg via INTRAVENOUS
  Filled 2014-03-05: qty 1

## 2014-03-05 MED ORDER — NITROGLYCERIN 0.4 MG SL SUBL
0.4000 mg | SUBLINGUAL_TABLET | SUBLINGUAL | Status: DC | PRN
  Administered 2014-03-05: 0.4 mg via SUBLINGUAL

## 2014-03-05 MED ORDER — AMLODIPINE BESYLATE 10 MG PO TABS
10.0000 mg | ORAL_TABLET | Freq: Every day | ORAL | Status: DC
  Administered 2014-03-06: 10 mg via ORAL
  Filled 2014-03-05: qty 1

## 2014-03-05 MED ORDER — SODIUM CHLORIDE 0.9 % IV SOLN
INTRAVENOUS | Status: AC
  Administered 2014-03-05: 21:00:00 via INTRAVENOUS

## 2014-03-05 MED ORDER — HYDROCHLOROTHIAZIDE 12.5 MG PO CAPS
12.5000 mg | ORAL_CAPSULE | Freq: Every day | ORAL | Status: DC
  Administered 2014-03-06: 12.5 mg via ORAL
  Filled 2014-03-05: qty 1

## 2014-03-05 MED ORDER — LISINOPRIL-HYDROCHLOROTHIAZIDE 20-12.5 MG PO TABS: 1.0000 | ORAL_TABLET | Freq: Every day | ORAL | Status: DC

## 2014-03-05 MED ORDER — ONDANSETRON HCL 4 MG/2ML IJ SOLN
4.0000 mg | Freq: Three times a day (TID) | INTRAMUSCULAR | Status: AC | PRN
  Administered 2014-03-05: 4 mg via INTRAVENOUS
  Filled 2014-03-05: qty 2

## 2014-03-05 MED ORDER — ASPIRIN 81 MG PO CHEW
324.0000 mg | CHEWABLE_TABLET | Freq: Once | ORAL | Status: AC
  Administered 2014-03-05: 324 mg via ORAL
  Filled 2014-03-05: qty 4

## 2014-03-05 NOTE — ED Provider Notes (Signed)
CSN: [id number]     Arrival date & time 03/05/14  1349 History   First MD Initiated Contact with Patient 03/05/14 1726     Chief Complaint  Patient presents with  . Chest Pain     (Consider location/radiation/quality/duration/timing/severity/associated sxs/prior Treatment) HPI Comments: 38 year old male with history of high blood pressure, kidney stones presents with congestion, cough and mild chest pressure since yesterday afternoon. Started while at rest. Nothing relieves the pressure nothing worsens. Felt mild shortness of breath yesterday that resolved.  Mild lightheaded lightheadedness. No chest pain or radiation of symptoms. Patient denies blood clot history, active cancer, recent major trauma or surgery, unilateral leg swelling/ pain, recent long travel, hemoptysis or oral contraceptives. Patient feels well otherwise. No exertional or diaphoresis component. No known heart problems or family history of heart attack was made to 55.    Patient is a 38 y.o. male presenting with chest pain. The history is provided by the patient.  Chest Pain Associated symptoms: cough and shortness of breath   Associated symptoms: no abdominal pain, no back pain, no fever, no headache and not vomiting     Past Medical History  Diagnosis Date  . Hypertension   . Arthritis   . History of kidney stones    Past Surgical History  Procedure Laterality Date  . Cholecystectomy    . Nephrolithotomy Left 03/30/2013    Procedure: NEPHROLITHOTOMY PERCUTANEOUS;  Surgeon: Valetta Fuller, MD;  Location: [facility];  Service: Urology;  Laterality: Left;   Family History  Problem Relation Age of Onset  . Hypertension Mother    History  Substance Use Topics  . Smoking status: Never Smoker   . Smokeless tobacco: Not on file  . Alcohol Use: No    Review of Systems  Constitutional: Negative for fever and chills.  HENT: Positive for congestion.   Eyes: Negative for visual disturbance.  Respiratory: Positive for  cough and shortness of breath.   Cardiovascular: Positive for chest pain. Negative for leg swelling.  Gastrointestinal: Negative for vomiting and abdominal pain.  Genitourinary: Negative for dysuria and flank pain.  Musculoskeletal: Negative for back pain, neck pain and neck stiffness.  Skin: Negative for rash.  Neurological: Positive for light-headedness. Negative for headaches.      Allergies  Review of patient's allergies indicates no known allergies.  Home Medications   Current Outpatient Rx  Name  Route  Sig  Dispense  Refill  . amLODipine (NORVASC) 10 MG tablet   Oral   Take 10 mg by mouth daily.         Marland Kitchen lisinopril-hydrochlorothiazide (PRINZIDE,ZESTORETIC) 20-12.5 MG per tablet   Oral   Take 1 tablet by mouth daily.          BP 112/73  Pulse 87  Temp(Src) 98.2 F (36.8 C) (Oral)  Resp 22  Ht 5\' 11"  (1.803 m)  Wt 270 lb (122.471 kg)  BMI 37.67 kg/m2  SpO2 95% Physical Exam  Nursing note and vitals reviewed. Constitutional: He is oriented to person, place, and time. He appears well-developed and well-nourished.  HENT:  Head: Normocephalic and atraumatic.  Eyes: Conjunctivae are normal. Right eye exhibits no discharge. Left eye exhibits no discharge.  Neck: Normal range of motion. Neck supple. No tracheal deviation present.  Cardiovascular: Regular rhythm.  Tachycardia present.   No murmur heard. Pulmonary/Chest: Effort normal and breath sounds normal.  Abdominal: Soft. He exhibits no distension. There is no tenderness. There is no guarding.  Musculoskeletal: He exhibits no edema.  Neurological: He is alert and oriented to person, place, and time.  Skin: Skin is warm. No rash noted.  Psychiatric: He has a normal mood and affect.    ED Course  Procedures (including critical care time) Labs Review Labs Reviewed  BASIC METABOLIC PANEL - Abnormal; Notable for the following:    Glucose, Bld 109 (*)    All other components within normal limits  CBC   TROPONIN I  URINE RAPID DRUG SCREEN (HOSP PERFORMED)  ETHANOL  TROPONIN I  TROPONIN I  LIPID PANEL  I-STAT TROPOININ, ED  I-STAT TROPOININ, ED   Imaging Review Dg Chest 2 View  03/05/2014   CLINICAL DATA:  Chest pain with history of hypertension  EXAM: CHEST  2 VIEW  COMPARISON:  PA and lateral chest x-ray of November 21, 2012  FINDINGS: The lungs are adequately inflated. There is no focal infiltrate. The cardiac silhouette is normal in size. The pulmonary vascularity is not engorged. The mediastinum is normal in width. There is no pleural effusion. The observed portions of the bony thorax appear normal.  IMPRESSION: There is no evidence of active cardiopulmonary disease.   Electronically Signed   By: David  [place]   On: 03/05/2014 14:31     EKG Interpretation   Date/Time:  Friday March 05 2014 13:56:49 EDT Ventricular Rate:  101 PR Interval:  158 QRS Duration: 98 QT Interval:  330 QTC Calculation: 427 R Axis:   59 Text Interpretation:  Sinus tachycardia Otherwise normal ECG similar to  previous Confirmed by Jarrel Knoke  MD, Cambell Rickenbach ([id number]) on 03/05/2014 6:29:36 PM      MDM   Final diagnoses:  Chest pain  HTN (hypertension)   Patient with atypical chest pain and with bronchitis and congested. With high blood pressure history and atypical chest pain cardiac screen was done including delta troponin chest x-ray and EKG which were unremarkable. Patient has no chest pressure on exam.  Discussed if it recurs he may need a stress test outpatient constrict reasons to return given. Patient very low risk Wells and heart score. Initial ekg similar to previous.  Mild chest pressure worsened, ekg repeted, mild avL elevation.  Paged cardiology stat. Asa ordered.   Date: 03/05/2014  Rate: 82  Rhythm: normal sinus rhythm  QRS Axis: normal  Intervals: normal  ST/T Wave abnormalities: nonspecific ST changes and ST elevations laterally  Conduction Disutrbances:none  Narrative Interpretation:    Old EKG Reviewed: changes noted  Subtle ekg changes, spoke with interventionalist and cardiologist, they do not feel acute MI.   Spoke with [facility] for observation, CIEs and stress test.  The patients results and plan were reviewed and discussed.   Any x-rays performed were personally reviewed by myself.   Differential diagnosis were considered with the presenting HPI.    EKG: reviewed  Filed Vitals:   03/05/14 1945 03/05/14 2000 03/05/14 2037 03/06/14 0029  BP: 126/82 129/85 127/82 111/71  Pulse: 84 88 80 70  Temp:   98.8 F (37.1 C) 98.2 F (36.8 C)  TempSrc:   Oral Oral  Resp: 22 20 20 20   Height:      Weight:      SpO2: 96% 95% 97% 95%    Admission/ observation were discussed with the admitting physician, patient and/or family and they are comfortable with the plan.     Enid SkeensJoshua M Mylinh Cragg, MD 03/06/14 [phone]

## 2014-03-05 NOTE — Consult Note (Signed)
CARDIOLOGY CONSULT NOTE   Patient ID: Brett BlanchRamon L Hamilton MRN: [id number] DOB/AGE: 1975/12/06 38 y.o.  Admit date: 03/05/2014  Primary Physician   Darrow BussingKOIRALA,[name], MD Primary Cardiologist   New Reason for Consultation   Chest pain  NWG:NFAOZHPI:[name] [name] is a 38 y.o. male with no history of CAD.  The patient has h/o HTN that started in his 7420's.  The patient's chest pain started yesterday, it was retrosternal with radiation to the jaw and left arm. It occurred again today and got progressively worse and was associated with nausea and weakness, and was relieved by NTG in the ER.  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 He is very physically active at work and has never experienced chest pain before yesterday. He is compliant with his BP meds. His cholesterol was checked two years ago and was high (doesn't know the actual value) but no medication was recommended. His whole family has h/o early HTN. His brother had a myocardial infarction at age 940. Both grandparents MI in their 4660's .   Past Medical History  Diagnosis Date  . Hypertension   . Arthritis   . History of kidney stones      Past Surgical History  Procedure Laterality Date  . Cholecystectomy    . Nephrolithotomy Left 03/30/2013    Procedure: NEPHROLITHOTOMY PERCUTANEOUS;  Surgeon: Valetta Fulleravid S Grapey, MD;  Location: [facility];  Service: Urology;  Laterality: Left;    No Known Allergies  I have reviewed the patient's current medications     HYDROmorphone (DILAUDID) injection, nitroGLYCERIN, ondansetron (ZOFRAN) IV  Prior to Admission medications   Medication Sig Start Date End Date Taking? Authorizing Provider  amLODipine (NORVASC) 10 MG tablet Take 10 mg by mouth daily.   Yes Historical Provider, MD  lisinopril-hydrochlorothiazide (PRINZIDE,ZESTORETIC) 20-12.5 MG per tablet Take 1 tablet by mouth daily.   Yes Historical Provider, MD     History   Social History  . Marital Status: Divorced    Spouse Name: [name]    Number of  Children: [name]  . Years of Education: [name]   Occupational History  . Restaurant [employer]   Social History Main Topics  . Smoking status: Never Smoker   . Smokeless tobacco: Not on file  . Alcohol Use: No  . Drug Use: No  . Sexual Activity: Yes   Other Topics Concern  . Not on file   Social History Narrative   Lives with roommate.    Family Status  Relation Status Death Age  . Mother Alive     No CAD  . Father Alive     Hx CVA  . Brother Alive     Hx MI at 6440   Family History  Problem Relation Age of Onset  . Hypertension Mother      ROS:  Full 14 point review of systems complete and found to be negative unless listed above.  Physical Exam: Blood pressure 129/85, pulse 88, temperature 98.2 F (36.8 C), temperature source Oral, resp. rate 20, height 5\' 11"  (1.803 m), weight 270 lb (122.471 kg), SpO2 95.00%.  General: Well developed, well nourished, male in no acute distress Head: Eyes PERRLA, No xanthomas.   Normocephalic and atraumatic, oropharynx without edema or exudate. Dentition: good Lungs: CTA bilaterally Heart: HRRR S1 S2, no rub/gallop, no murmur. pulses are 2+ all 4 extrem.   Neck: No carotid bruits. No lymphadenopathy.  JVD not elevated. Abdomen: Bowel sounds present, abdomen soft and non-tender  without masses or hernias noted. Msk:  No spine or cva tenderness. No weakness, no joint deformities or effusions. Extremities: No clubbing or cyanosis. No edema.  Neuro: Alert and oriented X 3. No focal deficits noted. Psych:  Good affect, responds appropriately Skin: No rashes or lesions noted.  Labs:   Lab Results  Component Value Date   WBC 10.5 03/05/2014   HGB 16.6 03/05/2014   HCT 47.9 03/05/2014   MCV 84.0 03/05/2014   PLT 233 03/05/2014     Recent Labs Lab 03/05/14 1404  NA 137  K 4.5  CL 99  CO2 23  BUN 11  CREATININE 1.01  CALCIUM 9.3  GLUCOSE 109*    Recent Labs  03/05/14 1425 03/05/14 1858  TROPIPOC 0.00 0.00   ECG:  SR,  LVH w/ strain pattern, ischemia less likely  Radiology:  Dg Chest 2 View 03/05/2014   CLINICAL DATA:  Chest pain with history of hypertension  EXAM: CHEST  2 VIEW  COMPARISON:  PA and lateral chest x-ray of November 21, 2012  FINDINGS: The lungs are adequately inflated. There is no focal infiltrate. The cardiac silhouette is normal in size. The pulmonary vascularity is not engorged. The mediastinum is normal in width. There is no pleural effusion. The observed portions of the bony thorax appear normal.  IMPRESSION: There is no evidence of active cardiopulmonary disease.   Electronically Signed   By: [name]   On: 03/05/2014 14:31    ASSESSMENT AND PLAN:   The patient was seen today by Dr. Delton See, the patient evaluated and the data reviewed.   Principal Problem:   Chest pain Active Problems:   HTN (hypertension)  Signed: [name], PA-C 03/05/2014 8:20 PM Beeper [phone]  The patient was seen, examined and discussed with Theodore Demark, PA-C and I agree with the above.   38 year old male with h/o hypertension with very significant FH of CAD who presented with typical chest pain. ECG has minimal STE in aVL, however the fact that his pain started yesterday and there are 2 negative troponins is reassuring. We will start aspirin, metoprolol, atorvastatin, heparin drip until the 3. troponin result is back. If negative we will perform a nuclear stress test in the morning. Order echocardiogram for the evaluation of systolic and diastolic function, degree of LVH and possible WMA.  Lars Masson 03/05/2014

## 2014-03-05 NOTE — ED Notes (Signed)
PT reports onset of CP yesterday. Tightness and pressure in his chest and feels ~lightheaded. PT states he has had diaphoresis and nausea yesterday. Denies hx of cardiac other than HTN

## 2014-03-05 NOTE — H&P (Addendum)
[facility] History and Physical  AVERI CACIOPPO ZOX:[id number] DOB: [birth year] DOA: 03/05/2014  Referring physician: ER physician PCP: Darrow Bussing, MD   Chief Complaint: chest pain  HPI:  38 year old male with past medical history of hypertension who presented to [facility] ED 03/05/2014 with chest pressure for past 24 hours prior to this admission associated with mild shortness of breath and cough. Chest pressure is about 3-4/10 in intensity and non radiating, on mid sternal area, started at rest. He did not take any pain meds at home but his pain improved in ED. No palpitations. No lightheadedness or loss of consciousness. No abdominal pain, nausea or vomiting. No blood in stool or urine. No blurry vision. In ED, vitals are stable: BP 112/73; HR 84, Tmax 98.2 F and oxygen saturation was 95% on room air. His 12 lead EKG showed nonspecific ST elevations in lateral lead but other 12 lead EKGs showed sinus rhythm and sinus tachycardia. ED consulted with interventional radiology who recommended cycling cardiac enzymes. First troponin was WNL. Blood work was unremarkable. CXR did not reveal acute cardiopulmonary findings. ED consulted cardiology as well.  Assessment and Plan:  Principal Problem:   Chest pain - pt had nonspecific ST elevations in lateral leads. Cardio consulted and please follow up their recomendations - cycle cardiac enzymes x 3 sets, obtain 2 D ECHO - follow up lipid panel - continue aspirin daily - check UDS and ethanol level  - morphine ordered PRN for chest pain Active Problems:   HTN (hypertension) - restart Norvasc and lisinopril  Radiological Exams on Admission: Dg Chest 2 View 03/05/2014  IMPRESSION: There is no evidence of active cardiopulmonary disease.    Code Status: Full Family Communication: Pt at bedside Disposition Plan: Admit for further evaluation  Alison Murray, MD  [facility] Pager [phone]  Review of Systems:  Constitutional: Negative  for fever, chills and malaise/fatigue. Negative for diaphoresis.  HENT: Negative for hearing loss, ear pain, nosebleeds, congestion, sore throat, neck pain, tinnitus and ear discharge.   Eyes: Negative for blurred vision, double vision, photophobia, pain, discharge and redness.  Respiratory: Negative for cough, hemoptysis, sputum production, shortness of breath, wheezing and stridor.   Cardiovascular: per HPI  Gastrointestinal: Negative for nausea, vomiting and abdominal pain. Negative for heartburn, constipation, blood in stool and melena.  Genitourinary: Negative for dysuria, urgency, frequency, hematuria and flank pain.  Musculoskeletal: Negative for myalgias, back pain, joint pain and falls.  Skin: Negative for itching and rash.  Neurological: Negative for dizziness and weakness. Negative for tingling, tremors, sensory change, speech change, focal weakness, loss of consciousness and headaches.  Endo/Heme/Allergies: Negative for environmental allergies and polydipsia. Does not bruise/bleed easily.  Psychiatric/Behavioral: Negative for suicidal ideas. The patient is not nervous/anxious.      Past Medical History  Diagnosis Date  . Hypertension   . Arthritis   . History of kidney stones    Past Surgical History  Procedure Laterality Date  . Cholecystectomy    . Nephrolithotomy Left 03/30/2013    Procedure: NEPHROLITHOTOMY PERCUTANEOUS;  Surgeon: Valetta Fuller, MD;  Location: [facility];  Service: Urology;  Laterality: Left;   Social History:  reports that he has never smoked. He does not have any smokeless tobacco history on file. He reports that he does not drink alcohol or use illicit drugs.  No Known Allergies  Family History  Problem Relation Age of Onset  . Hypertension Mother      Prior to Admission medications  Medication Sig Start Date End Date Taking? Authorizing Provider  amLODipine (NORVASC) 10 MG tablet Take 10 mg by mouth daily.   Yes Historical Provider, MD   lisinopril-hydrochlorothiazide (PRINZIDE,ZESTORETIC) 20-12.5 MG per tablet Take 1 tablet by mouth daily.   Yes Historical Provider, MD   Physical Exam: Filed Vitals:   03/05/14 1402 03/05/14 1805 03/05/14 1900 03/05/14 1915  BP: 145/74 112/73 127/80 130/80  Pulse: 101 87 91 84  Temp: 98.2 F (36.8 C)     TempSrc: Oral     Resp: 18 22 24 21   Height: 5\' 11"  (1.803 m)     Weight: 122.471 kg (270 lb)     SpO2: 97% 95% 95% 95%    Physical Exam  Constitutional: Appears well-developed and well-nourished. No distress.  HENT: Normocephalic. External right and left ear normal. Oropharynx is clear and moist.  Eyes: Conjunctivae and EOM are normal. PERRLA, no scleral icterus.  Neck: Normal ROM. Neck supple. No JVD. No tracheal deviation. No thyromegaly.  CVS: RRR, S1/S2 +, no murmurs, no gallops, no carotid bruit.  Pulmonary: Effort and breath sounds normal, no stridor, rhonchi, wheezes, rales.  Abdominal: Soft. BS +,  no distension, tenderness, rebound or guarding.  Musculoskeletal: Normal range of motion. No edema and no tenderness.  Lymphadenopathy: No lymphadenopathy noted, cervical, inguinal. Neuro: Alert. Normal reflexes, muscle tone coordination. No cranial nerve deficit. Skin: Skin is warm and dry. No rash noted. Not diaphoretic. No erythema. No pallor.  Psychiatric: Normal mood and affect. Behavior, judgment, thought content normal.   Labs on Admission:  Basic Metabolic Panel:  Recent Labs Lab 03/05/14 1404  NA 137  K 4.5  CL 99  CO2 23  GLUCOSE 109*  BUN 11  CREATININE 1.01  CALCIUM 9.3   Liver Function Tests: No results found for this basename: AST, ALT, ALKPHOS, BILITOT, PROT, ALBUMIN,  in the last 168 hours No results found for this basename: LIPASE, AMYLASE,  in the last 168 hours No results found for this basename: AMMONIA,  in the last 168 hours CBC:  Recent Labs Lab 03/05/14 1404  WBC 10.5  HGB 16.6  HCT 47.9  MCV 84.0  PLT 233   Cardiac Enzymes: No  results found for this basename: CKTOTAL, CKMB, CKMBINDEX, TROPONINI,  in the last 168 hours BNP: No components found with this basename: POCBNP,  CBG: No results found for this basename: GLUCAP,  in the last 168 hours  If 7PM-7AM, please contact night-coverage [email or web address] Password [email or web address] 03/05/2014, 7:46 PM

## 2014-03-05 NOTE — ED Notes (Signed)
He c/o pressure in his chest since yesterday, onset while at rest. Nothing relieves the pain, nothing increases. hes felt dizzy, nauseated and sob since the pain started. A&ox4, resp e/u

## 2014-03-05 NOTE — ED Notes (Signed)
Noticed PT heart rhythm looked different and took another EKG. Took to [facility]. MD paged interventionalist for consult

## 2014-03-06 ENCOUNTER — Other Ambulatory Visit: Payer: Self-pay

## 2014-03-06 ENCOUNTER — Other Ambulatory Visit (HOSPITAL_COMMUNITY): Payer: 59

## 2014-03-06 ENCOUNTER — Observation Stay (HOSPITAL_COMMUNITY): Payer: No Typology Code available for payment source

## 2014-03-06 ENCOUNTER — Encounter (HOSPITAL_COMMUNITY): Payer: Self-pay | Admitting: *Deleted

## 2014-03-06 DIAGNOSIS — I517 Cardiomegaly: Secondary | ICD-10-CM

## 2014-03-06 DIAGNOSIS — R079 Chest pain, unspecified: Secondary | ICD-10-CM

## 2014-03-06 LAB — LIPID PANEL
Cholesterol: 145 mg/dL (ref 0–200)
HDL: 25 mg/dL — ABNORMAL LOW (ref >39)
LDL Cholesterol: 83 mg/dL (ref 0–99)
Total CHOL/HDL Ratio: 5.8 RATIO
Triglycerides: 184 mg/dL — ABNORMAL HIGH (ref <150)
VLDL: 37 mg/dL (ref 0–40)

## 2014-03-06 LAB — TROPONIN I: Troponin I: <0.3 ng/mL (ref <0.30)

## 2014-03-06 MED ORDER — LISINOPRIL-HYDROCHLOROTHIAZIDE 20-12.5 MG PO TABS: 1.0000 | ORAL_TABLET | Freq: Every day | ORAL | Status: DC

## 2014-03-06 MED ORDER — LORATADINE 10 MG PO TABS
10.0000 mg | ORAL_TABLET | Freq: Every day | ORAL | Status: DC
  Administered 2014-03-06: 10 mg via ORAL
  Filled 2014-03-06: qty 1

## 2014-03-06 MED ORDER — AMLODIPINE BESYLATE 10 MG PO TABS: 10.0000 mg | ORAL_TABLET | Freq: Every day | ORAL | Status: DC

## 2014-03-06 MED ORDER — TECHNETIUM TC 99M SESTAMIBI - CARDIOLITE
30.0000 | Freq: Once | INTRAVENOUS | Status: AC | PRN
  Administered 2014-03-06: 10:00:00 30 via INTRAVENOUS

## 2014-03-06 MED ORDER — NITROGLYCERIN 0.4 MG SL SUBL: 0.4000 mg | SUBLINGUAL_TABLET | SUBLINGUAL | Status: DC | PRN

## 2014-03-06 MED ORDER — ATORVASTATIN CALCIUM 20 MG PO TABS: 20.0000 mg | ORAL_TABLET | Freq: Every day | ORAL | Status: DC

## 2014-03-06 MED ORDER — TECHNETIUM TC 99M SESTAMIBI - CARDIOLITE
10.0000 | Freq: Once | INTRAVENOUS | Status: AC | PRN
  Administered 2014-03-06: 08:00:00 10 via INTRAVENOUS

## 2014-03-06 NOTE — Progress Notes (Signed)
Utilization Review Completed.  

## 2014-03-06 NOTE — Progress Notes (Signed)
   ETT-Myoview  Patient exercised 8:48 ECG:  There was no significant ST segment depression to suggest ischemia. Symptoms:  There was no chest pain. Images Pending. SignedTereso Newcomer,  Tyrail Grandfield, PA-C   03/06/2014 10:03 AM   Pager # [phone]

## 2014-03-06 NOTE — Discharge Summary (Signed)
Physician Discharge Summary  Brett Hamilton ZOX:[id number] DOB: 1976/10/31 DOA: 03/05/2014  PCP: Darrow Bussing, MD  Admit date: 03/05/2014 Discharge date: 03/06/2014  Time spent: 60 minutes  Recommendations for Outpatient Follow-up:  1. Followup with Dr. Delton See of cardiology in 2 weeks. 2-D echo results will need to be followed up upon.  Discharge Diagnoses:  Principal Problem:   Chest pain Active Problems:   HTN (hypertension)   Discharge Condition: Stable and improved  Diet recommendation: Heart healthy  Filed Weights   03/05/14 1402  Weight: 122.471 kg (270 lb)    History of present illness:  38 year old male with past medical history of hypertension who presented to [facility] ED 03/05/2014 with chest pressure for past 24 hours prior to this admission associated with mild shortness of breath and cough. Chest pressure is about 3-4/10 in intensity and non radiating, on mid sternal area, started at rest. He did not take any pain meds at home but his pain improved in ED. No palpitations. No lightheadedness or loss of consciousness. No abdominal pain, nausea or vomiting. No blood in stool or urine. No blurry vision.  In ED, vitals are stable: BP 112/73; HR 84, Tmax 98.2 F and oxygen saturation was 95% on room air. His 12 lead EKG showed nonspecific ST elevations in lateral lead but other 12 lead EKGs showed sinus rhythm and sinus tachycardia. ED consulted with interventional radiology who recommended cycling cardiac enzymes. First troponin was WNL. Blood work was unremarkable. CXR did not reveal acute cardiopulmonary findings. ED consulted cardiology as well.      Hospital Course:  #1 chest pain Patient had presented with a history of hypertension, strong family history of coronary artery disease who presented with chest pressure with associated shortness of breath. Patient's chest pain had typical features EKG showed nonspecific ST elevations in the lateral lead. Patient's chest pain  improved with nitroglycerin. Chest x-ray which was done was negative for any acute cardiopulmonary findings. Cardiology was consulted and patient was seen in consultation by Dr. Delton See on 03/05/2014. It was felt due to typical nature of patient's chest pain and strong family history the patient be admitted for chest pain rule out and a nuclear stress test. Cardiac enzymes which were done were negative x3. Lipid panel which was done had a LDL of 83 and a total cholesterol of 145 and the triglycerides of 184. Nuclear stress test which was done showed no evidence of prior infarction or exercise-induced ischemia. Under sonographic area of apparent disc herniation involving the inferior wall of the left ventricle apex. EF of 60%. 2-D echo was done and preliminary results per Dr. Delton See were normal. Patient improved clinically he was maintained on aspirin, Lipitor, lisinopril, HCTZ, Norvasc. Patient's chest pain improved and that resolved by day of discharge. Patient will followup with cardiology as outpatient.  #2 hypertension Patient was maintained on his home regimen of lisinopril, HCTZ and Norvasc. She will followup as outpatient.  The rest of patient's chronic medical issues remained stable throughout the hospitalization and patient was discharged in stable and improved condition.  Procedures:  Nuclear stress test 03/06/2014  2-D echo results pending 03/06/2014  Chest x-ray 03/05/2014    Consultations:  Cardiology: Dr Delton See 03/05/14  Discharge Exam: Filed Vitals:   03/06/14 1150  BP: 125/72  Pulse:   Temp:   Resp:     General: NAD Cardiovascular: RRR Respiratory: CTAB  Discharge Instructions You were cared for by a hospitalist during your hospital stay. If you have any  questions about your discharge medications or the care you received while you were in the hospital after you are discharged, you can call the unit and asked to speak with the hospitalist on call if the hospitalist  that took care of you is not available. Once you are discharged, your primary care physician will handle any further medical issues. Please note that NO REFILLS for any discharge medications will be authorized once you are discharged, as it is imperative that you return to your primary care physician (or establish a relationship with a primary care physician if you do not have one) for your aftercare needs so that they can reassess your need for medications and monitor your lab values.      Discharge Orders   Future Orders Complete By Expires   Diet - low sodium heart healthy  As directed    Discharge instructions  As directed    Increase activity slowly  As directed        Medication List         amLODipine 10 MG tablet  Commonly known as:  NORVASC  Take 1 tablet (10 mg total) by mouth daily.     atorvastatin 20 MG tablet  Commonly known as:  LIPITOR  Take 1 tablet (20 mg total) by mouth daily at 6 PM.     lisinopril-hydrochlorothiazide 20-12.5 MG per tablet  Commonly known as:  PRINZIDE,ZESTORETIC  Take 1 tablet by mouth daily.     nitroGLYCERIN 0.4 MG SL tablet  Commonly known as:  NITROSTAT  Place 1 tablet (0.4 mg total) under the tongue every 5 (five) minutes as needed for chest pain (CP or SOB).       No Known Allergies Follow-up Information   Follow up with Lars Masson, MD. Schedule an appointment as soon as possible for a visit in 2 weeks.   Specialty:  Cardiology   Contact information:   [address] [place] [address] [phone]        The results of significant diagnostics from this hospitalization (including imaging, microbiology, ancillary and laboratory) are listed below for reference.    Significant Diagnostic Studies: Dg Chest 2 View  03/05/2014   CLINICAL DATA:  Chest pain with history of hypertension  EXAM: CHEST  2 VIEW  COMPARISON:  PA and lateral chest x-ray of November 21, 2012  FINDINGS: The lungs are adequately  inflated. There is no focal infiltrate. The cardiac silhouette is normal in size. The pulmonary vascularity is not engorged. The mediastinum is normal in width. There is no pleural effusion. The observed portions of the bony thorax appear normal.  IMPRESSION: There is no evidence of active cardiopulmonary disease.   Electronically Signed   By: [name]   On: 03/05/2014 14:31   Nm Myocar Multi W/spect W/wall Motion / Ef  03/06/2014   CLINICAL DATA:  Chest pain, history of hypertension; family history of CAD  EXAM: MYOCARDIAL IMAGING WITH SPECT (REST AND EXERCISE)  GATED LEFT VENTRICULAR WALL MOTION STUDY  LEFT VENTRICULAR EJECTION FRACTION  TECHNIQUE: Standard myocardial SPECT imaging was performed after resting intravenous injection of 10 mCi Tc-14m sestamibi. Subsequently, exercise tolerance test was performed by the patient under the supervision of the Cardiology staff. At peak-stress, 30 mCi Tc-57m sestamibi was injected intravenously and standard myocardial SPECT imaging was performed. Note, patient was able to achieve maximum age predicted heart rate of 153 beats per min. Quantitative gated imaging was also performed to evaluate left ventricular  wall motion, and estimate left ventricular ejection fraction.  COMPARISON:  DG CHEST 2 VIEW dated 03/05/2014  FINDINGS: Review of the rotational raw images demonstrates mild patient motion artifact on the provided rest images. There is a focal area contamination involving the midline of the lower back on the provided stress images.  SPECT imaging demonstrates minimal amount of attenuation involving the inferior wall left ventricle which improves on the provided stress images.  Quantitative gated analysis demonstrates small geographic area dyskinesia involving the inferior wall extending towards the left ventricular apex. Otherwise, normal wall motion.  The resting left ventricular ejection fraction is 60% with end-diastolic volume of 115 ml and end-systolic  volume of 46 ml.  IMPRESSION: 1. No scintigraphic evidence of prior infarction or exercise induced ischemia. 2. Small geographic area of apparent dyskinesia involving the inferior wall of the left ventricular apex. Otherwise, normal wall motion. Ejection fraction - 60%.   Electronically Signed   By: Simonne ComeJohn  Watts M.D.   On: 03/06/2014 12:56    Microbiology: No results found for this or any previous visit (from the past 240 hour(s)).   Labs: Basic Metabolic Panel:  Recent Labs Lab 03/05/14 1404  NA 137  K 4.5  CL 99  CO2 23  GLUCOSE 109*  BUN 11  CREATININE 1.01  CALCIUM 9.3   Liver Function Tests: No results found for this basename: AST, ALT, ALKPHOS, BILITOT, PROT, ALBUMIN,  in the last 168 hours No results found for this basename: LIPASE, AMYLASE,  in the last 168 hours No results found for this basename: AMMONIA,  in the last 168 hours CBC:  Recent Labs Lab 03/05/14 1404  WBC 10.5  HGB 16.6  HCT 47.9  MCV 84.0  PLT 233   Cardiac Enzymes:  Recent Labs Lab 03/05/14 1948 03/06/14 0233  TROPONINI <0.30 <0.30   BNP: BNP (last 3 results) No results found for this basename: PROBNP,  in the last 8760 hours CBG: No results found for this basename: GLUCAP,  in the last 168 hours     Signed:  Rodolph Bonganiel V Inocencio Roy MD [facility] 03/06/2014, 3:26 PM

## 2014-03-06 NOTE — Progress Notes (Signed)
  Echocardiogram 2D Echocardiogram has been performed.  Jorje GuildKwong Xzayvier Fagin 03/06/2014, 3:18 PM

## 2014-03-06 NOTE — Progress Notes (Signed)
Brett Hamilton H Sherral Dirocco 03/06/2014  

## 2014-03-06 NOTE — Progress Notes (Signed)
    Patient Name: Brett Hamilton Date of Encounter: 03/06/2014  Principal Problem:   Chest pain Active Problems:   HTN (hypertension)   Length of Stay: 1  SUBJECTIVE  No chest pain this morning.   CURRENT MEDS . amLODipine  10 mg Oral Daily  . aspirin EC  325 mg Oral Daily  . atorvastatin  20 mg Oral q1800  . lisinopril  20 mg Oral Daily   And  . hydrochlorothiazide  12.5 mg Oral Daily    OBJECTIVE  Filed Vitals:   03/05/14 1945 03/05/14 2000 03/05/14 2037 03/06/14 0029  BP: 126/82 129/85 127/82 111/71  Pulse: 84 88 80 70  Temp:   98.8 F (37.1 C) 98.2 F (36.8 C)  TempSrc:   Oral Oral  Resp: 22 20 20 20   Height:      Weight:      SpO2: 96% 95% 97% 95%    Intake/Output Summary (Last 24 hours) at 03/06/14 0926 Last data filed at 03/06/14 0509  Gross per 24 hour  Intake 843.75 ml  Output    150 ml  Net 693.75 ml   Filed Weights   03/05/14 1402  Weight: 270 lb (122.471 kg)   PHYSICAL EXAM  General: Pleasant, NAD. Obese. Neuro: Alert and oriented X 3. Moves all extremities spontaneously. Psych: Normal affect. HEENT:  Normal  Neck: Supple without bruits or JVD. Lungs:  Resp regular and unlabored, CTA. Heart: RRR no s3, s4, or murmurs. Abdomen: Soft, non-tender, non-distended, BS + x 4.  Extremities: No clubbing, cyanosis or edema. DP/PT/Radials 2+ and equal bilaterally.  Accessory Clinical Findings  CBC  Recent Labs  03/05/14 1404  WBC 10.5  HGB 16.6  HCT 47.9  MCV 84.0  PLT 233   Basic Metabolic Panel  Recent Labs  03/05/14 1404  NA 137  K 4.5  CL 99  CO2 23  GLUCOSE 109*  BUN 11  CREATININE 1.01  CALCIUM 9.3   Cardiac Enzymes  Recent Labs  03/05/14 1948 03/06/14 0233  TROPONINI <0.30 <0.30   Fasting Lipid Panel  Recent Labs  03/06/14 0233  CHOL 145  HDL 25*  LDLCALC 83  TRIG 161184*  CHOLHDL 5.8   Radiology/Studies  Dg Chest 2 View  03/05/2014   CLINICAL DATA:  Chest pain with history of hypertension     IMPRESSION: There is no evidence of active cardiopulmonary disease.     TELE: SR, 2.1 second pause this am  ECG: NSR, normal ECG    ASSESSMENT AND PLAN  38 y.o. male with no history of CAD and h/o HTN that started in his 3920's. Significant FH of CAD.  1. Typical chest pain - normal ECG, negative enzymes. We will stop heparin, continue ASA, statin. Await echocardiogram and nuclear stress, if normal discharge home.  2. HTN - controlled on current regimen.  3. High TAG, low HDL - we will continue statins    Signed, Lars MassonKatarina H Chrisanne Loose MD, [facility] 03/06/2014

## 2014-10-26 ENCOUNTER — Encounter (HOSPITAL_COMMUNITY): Payer: Self-pay | Admitting: Emergency Medicine

## 2014-10-26 ENCOUNTER — Emergency Department (HOSPITAL_COMMUNITY)
Admission: EM | Admit: 2014-10-26 | Discharge: 2014-10-26 | Disposition: A | Discharge status: Home / Self Care | Payer: No Typology Code available for payment source | Attending: Emergency Medicine | Admitting: Emergency Medicine

## 2014-10-26 ENCOUNTER — Emergency Department (HOSPITAL_COMMUNITY): Payer: No Typology Code available for payment source

## 2014-10-26 DIAGNOSIS — M19019 Primary osteoarthritis, unspecified shoulder: Secondary | ICD-10-CM

## 2014-10-26 DIAGNOSIS — M19011 Primary osteoarthritis, right shoulder: Secondary | ICD-10-CM | POA: Diagnosis not present

## 2014-10-26 DIAGNOSIS — M79601 Pain in right arm: Secondary | ICD-10-CM | POA: Diagnosis not present

## 2014-10-26 DIAGNOSIS — Z79899 Other long term (current) drug therapy: Secondary | ICD-10-CM | POA: Diagnosis not present

## 2014-10-26 DIAGNOSIS — I10 Essential (primary) hypertension: Secondary | ICD-10-CM | POA: Diagnosis not present

## 2014-10-26 DIAGNOSIS — M79603 Pain in arm, unspecified: Secondary | ICD-10-CM

## 2014-10-26 DIAGNOSIS — M25511 Pain in right shoulder: Secondary | ICD-10-CM | POA: Diagnosis not present

## 2014-10-26 DIAGNOSIS — M25519 Pain in unspecified shoulder: Secondary | ICD-10-CM

## 2014-10-26 DIAGNOSIS — Z87442 Personal history of urinary calculi: Secondary | ICD-10-CM | POA: Insufficient documentation

## 2014-10-26 MED ORDER — HYDROCODONE-ACETAMINOPHEN 5-325 MG PO TABS: 1.0000 | ORAL_TABLET | Freq: Four times a day (QID) | ORAL | Status: DC | PRN

## 2014-10-26 MED ORDER — OXYCODONE-ACETAMINOPHEN 5-325 MG PO TABS
1.0000 | ORAL_TABLET | Freq: Once | ORAL | Status: AC
  Administered 2014-10-26: 1 via ORAL
  Filled 2014-10-26: qty 1

## 2014-10-26 MED ORDER — FENTANYL CITRATE 0.05 MG/ML IJ SOLN: 100.0000 ug | Freq: Once | INTRAMUSCULAR | Status: DC

## 2014-10-26 MED ORDER — MELOXICAM 7.5 MG PO TABS: 7.5000 mg | ORAL_TABLET | Freq: Every day | ORAL | Status: DC

## 2014-10-26 NOTE — ED Provider Notes (Signed)
CSN: [id number]     Arrival date & time 10/26/14  [id number] History   First MD Initiated Contact with Patient 10/26/14 [phone]     Chief Complaint  Patient presents with  . Shoulder Pain   HPI  Patient is a 38 year old male who presents to the emergency room for evaluation of right shoulder pain that started this morning at 2 AM. He states that this pain is located in the anterior of his shoulder near the [facility]. He states that the pain is constant, 8 out of 10, and aching in nature. It does not radiate down to his fingers. He denies any neck pain. He states that his pain is worse with moving his shoulder. He has not tried any relieving factors prior to coming here. He has no injuries that he can think of. 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 Patient is right-handed. Patient has no prior injuries to the shoulder.  Past Medical History  Diagnosis Date  . Hypertension   . Arthritis   . History of kidney stones    Past Surgical History  Procedure Laterality Date  . Cholecystectomy    . Nephrolithotomy Left 03/30/2013    Procedure: NEPHROLITHOTOMY PERCUTANEOUS;  Surgeon: Valetta Fulleravid S Grapey, MD;  Location: [facility];  Service: Urology;  Laterality: Left;   Family History  Problem Relation Age of Onset  . Hypertension Mother    History  Substance Use Topics  . Smoking status: Never Smoker   . Smokeless tobacco: Not on file  . Alcohol Use: No    Review of Systems  Constitutional: Negative for fever, chills and fatigue.  Musculoskeletal: Positive for arthralgias. Negative for joint swelling and gait problem.  Skin: Negative for color change, rash and wound.  Neurological: Negative for numbness.  All other systems reviewed and are negative.     Allergies  Review of patient's allergies indicates no known allergies.  Home Medications   Prior to Admission medications   Medication Sig Start Date End Date Taking? Authorizing Provider  amLODipine  (NORVASC) 10 MG tablet Take 1 tablet (10 mg total) by mouth daily. 03/06/14  Yes Rodolph Bonganiel Thompson V, MD  lisinopril-hydrochlorothiazide (PRINZIDE,ZESTORETIC) 20-12.5 MG per tablet Take 1 tablet by mouth daily. 03/06/14  Yes Rodolph Bonganiel Thompson V, MD  Multiple Vitamin (ONE-A-DAY MENS PO) Take 1 tablet by mouth daily.   Yes Historical Provider, MD  nitroGLYCERIN (NITROSTAT) 0.4 MG SL tablet Place 1 tablet (0.4 mg total) under the tongue every 5 (five) minutes as needed for chest pain (CP or SOB). 03/06/14  Yes Rodolph Bonganiel Thompson V, MD  atorvastatin (LIPITOR) 20 MG tablet Take 1 tablet (20 mg total) by mouth daily at 6 PM. Patient not taking: Reported on 10/26/2014 03/06/14   Rodolph Bonganiel Thompson V, MD   BP 121/82 mmHg  Pulse 80  Temp(Src) 98.5 F (36.9 C) (Oral)  Resp 16  Ht 6' (1.829 m)  Wt 265 lb (120.203 kg)  BMI 35.93 kg/m2  SpO2 96% Physical Exam  Constitutional: He is oriented to person, place, and time. He appears well-developed and well-nourished. No distress.  HENT:  Head: Normocephalic and atraumatic.  Mouth/Throat: Oropharynx is clear and moist. No oropharyngeal exudate.  Eyes: Conjunctivae and EOM are normal. Pupils are equal, round, and reactive to light. No scleral icterus.  Neck: Normal range of motion. Neck supple. No JVD present. No thyromegaly present.  Cardiovascular: Normal rate, regular rhythm, normal heart sounds and intact distal pulses.  Exam reveals no gallop and no friction rub.   No murmur heard. Pulses:      Radial pulses are 2+ on the right side, and 2+ on the left side.  Pulmonary/Chest: Effort normal and breath sounds normal. No respiratory distress. He has no wheezes. He has no rales. He exhibits no tenderness.  Musculoskeletal:       Right shoulder: He exhibits decreased range of motion, tenderness, bony tenderness, pain and decreased strength. He exhibits no swelling, no effusion, no crepitus, no deformity, no laceration, no spasm and normal pulse.       Right hand: Normal  sensation noted. Normal strength noted.  Slightly limited range of motion with forward flexion, abduction, internal and external rotation. Positive speed's test. Equivocal empty can test. Strength testing is difficult due to pain.  Lymphadenopathy:    He has no cervical adenopathy.  Neurological: He is alert and oriented to person, place, and time.  Skin: Skin is warm and dry. He is not diaphoretic.  Psychiatric: He has a normal mood and affect. His behavior is normal. Judgment and thought content normal.  Nursing note and vitals reviewed.   ED Course  Procedures (including critical care time) Labs Review Labs Reviewed - No data to display  Imaging Review No results found.   EKG Interpretation None      MDM   Final diagnoses:  Shoulder pain  Arm pain   Patient is a 38 year old male who presents to the emergency room for evaluation of right shoulder pain. Physical exam reveals neurovascularly intact right arm. Suspect that this is biceps tendinitis versus rotator cuff tendinitis versus acromioclavicular arthritis. Plain film x-rays were obtained here and reveals arthritis of the acromioclavicular joint. I cannot rule out tendinitis of the rotator cuff or biceps tendon here. Will discharge the patient home on Motrin once daily and will give a short course of hydrocodone for pain control. We'll refer the patient to orthopedics. Patient is to return for septic joint symptoms. Patient states understanding and agreement at this time. Patient is stable for discharge.    Eben Burowourtney A Forcucci, PA-C 10/26/14 [id number]  Ward GivensIva L Knapp, MD 10/26/14 [phone]

## 2014-10-26 NOTE — ED Notes (Signed)
Patient reports he woke up today with right shoulder pain 10/10 at 2am this morning. He is unsure what caused the pain, he works as a Investment banker, operationalchef.  Patient denies any shoulder surgery and falls. No obvious deformity on arrival.

## 2014-10-26 NOTE — Discharge Instructions (Signed)
Arthritis, Nonspecific °Arthritis is inflammation of a joint. This usually means pain, redness, warmth or swelling are present. One or more joints may be involved. There are a number of types of arthritis. Your caregiver may not be able to tell what type of arthritis you have right away. °CAUSES  °The most common cause of arthritis is the wear and tear on the joint (osteoarthritis). This causes damage to the cartilage, which can break down over time. The knees, hips, back and neck are most often affected by this type of arthritis. °Other types of arthritis and common causes of joint pain include: °· Sprains and other injuries near the joint. Sometimes minor sprains and injuries cause pain and swelling that develop hours later. °· Rheumatoid arthritis. This affects hands, feet and knees. It usually affects both sides of your body at the same time. It is often associated with chronic ailments, fever, weight loss and general weakness. °· Crystal arthritis. Gout and pseudo gout can cause occasional acute severe pain, redness and swelling in the foot, ankle, or knee. °· Infectious arthritis. Bacteria can get into a joint through a break in overlying skin. This can cause infection of the joint. Bacteria and viruses can also spread through the blood and affect your joints. °· Drug, infectious and allergy reactions. Sometimes joints can become mildly painful and slightly swollen with these types of illnesses. °SYMPTOMS  °· Pain is the main symptom. °· Your joint or joints can also be red, swollen and warm or hot to the touch. °· You may have a fever with certain types of arthritis, or even feel overall ill. °· The joint with arthritis will hurt with movement. Stiffness is present with some types of arthritis. °DIAGNOSIS  °Your caregiver will suspect arthritis based on your description of your symptoms and on your exam. Testing may be needed to find the type of arthritis: °· Blood and sometimes urine tests. °· X-ray tests  and sometimes CT or MRI scans. °· Removal of fluid from the joint (arthrocentesis) is done to check for bacteria, crystals or other causes. Your caregiver (or a specialist) will numb the area over the joint with a local anesthetic, and use a needle to remove joint fluid for examination. This procedure is only minimally uncomfortable. °· Even with these tests, your caregiver may not be able to tell what kind of arthritis you have. Consultation with a specialist (rheumatologist) may be helpful. °TREATMENT  °Your caregiver will discuss with you treatment specific to your type of arthritis. If the specific type cannot be determined, then the following general recommendations may apply. °Treatment of severe joint pain includes: °· Rest. °· Elevation. °· Anti-inflammatory medication (for example, ibuprofen) may be prescribed. Avoiding activities that cause increased pain. °· Only take over-the-counter or prescription medicines for pain and discomfort as recommended by your caregiver. °· Cold packs over an inflamed joint may be used for 10 to 15 minutes every hour. Hot packs sometimes feel better, but do not use overnight. Do not use hot packs if you are diabetic without your caregiver's permission. °· A cortisone shot into arthritic joints may help reduce pain and swelling. °· Any acute arthritis that gets worse over the next 1 to 2 days needs to be looked at to be sure there is no joint infection. °Long-term arthritis treatment involves modifying activities and lifestyle to reduce joint stress jarring. This can include weight loss. Also, exercise is needed to nourish the joint cartilage and remove waste. This helps keep the muscles   around the joint strong. HOME CARE INSTRUCTIONS   Do not take aspirin to relieve pain if gout is suspected. This elevates uric acid levels.  Only take over-the-counter or prescription medicines for pain, discomfort or fever as directed by your caregiver.  Rest the joint as much as  possible.  If your joint is swollen, keep it elevated.  Use crutches if the painful joint is in your leg.  Drinking plenty of fluids may help for certain types of arthritis.  Follow your caregiver's dietary instructions.  Try low-impact exercise such as:  Swimming.  Water aerobics.  Biking.  Walking.  Morning stiffness is often relieved by a warm shower.  Put your joints through regular range-of-motion. SEEK MEDICAL CARE IF:   You do not feel better in 24 hours or are getting worse.  You have side effects to medications, or are not getting better with treatment. SEEK IMMEDIATE MEDICAL CARE IF:   You have a fever.  You develop severe joint pain, swelling or redness.  Many joints are involved and become painful and swollen.  There is severe back pain and/or leg weakness.  You have loss of bowel or bladder control. Document Released: 12/20/2004 Document Revised: 02/04/2012 Document Reviewed: 01/05/2009 [facility] Patient Information 2015 [place], [place]. This information is not intended to replace advice given to you by your health care provider. Make sure you discuss any questions you have with your health care provider.  Shoulder Pain The shoulder is the joint that connects your arms to your body. The bones that form the shoulder joint include the upper arm bone (humerus), the shoulder blade (scapula), and the collarbone (clavicle). The top of the humerus is shaped like a ball and fits into a rather flat socket on the scapula (glenoid cavity). A combination of muscles and strong, fibrous tissues that connect muscles to bones (tendons) support your shoulder joint and hold the ball in the socket. Small, fluid-filled sacs (bursae) are located in different areas of the joint. They act as cushions between the bones and the overlying soft tissues and help reduce friction between the gliding tendons and the bone as you move your arm. Your shoulder joint allows a wide range of motion  in your arm. This range of motion allows you to do things like scratch your back or throw a ball. However, this range of motion also makes your shoulder more prone to pain from overuse and injury. Causes of shoulder pain can originate from both injury and overuse and usually can be grouped in the following four categories:  Redness, swelling, and pain (inflammation) of the tendon (tendinitis) or the bursae (bursitis).  Instability, such as a dislocation of the joint.  Inflammation of the joint (arthritis).  Broken bone (fracture). HOME CARE INSTRUCTIONS   Apply ice to the sore area.  Put ice in a plastic bag.  Place a towel between your skin and the bag.  Leave the ice on for 15-20 minutes, 3-4 times per day for the first 2 days, or as directed by your health care provider.  Stop using cold packs if they do not help with the pain.  If you have a shoulder sling or immobilizer, wear it as long as your caregiver instructs. Only remove it to shower or bathe. Move your arm as little as possible, but keep your hand moving to prevent swelling.  Squeeze a soft ball or foam pad as much as possible to help prevent swelling.  Only take over-the-counter or prescription medicines for pain, discomfort,  or fever as directed by your caregiver. SEEK MEDICAL CARE IF:   Your shoulder pain increases, or new pain develops in your arm, hand, or fingers.  Your hand or fingers become cold and numb.  Your pain is not relieved with medicines. SEEK IMMEDIATE MEDICAL CARE IF:   Your arm, hand, or fingers are numb or tingling.  Your arm, hand, or fingers are significantly swollen or turn white or blue. MAKE SURE YOU:   Understand these instructions.  Will watch your condition.  Will get help right away if you are not doing well or get worse. Document Released: 08/22/2005 Document Revised: 03/29/2014 Document Reviewed: 10/27/2011 [facility] Patient Information 2015 [place], [place]. This information is  not intended to replace advice given to you by your health care provider. Make sure you discuss any questions you have with your health care provider.   Emergency Department Resource Guide 1) Find a Doctor and Pay Out of Pocket Although you won't have to find out who is covered by your insurance plan, it is a good idea to ask around and get recommendations. You will then need to call the office and see if the doctor you have chosen will accept you as a new patient and what types of options they offer for patients who are self-pay. Some doctors offer discounts or will set up payment plans for their patients who do not have insurance, but you will need to ask so you aren't surprised when you get to your appointment.  2) Contact Your Local Health Department Not all health departments have doctors that can see patients for sick visits, but many do, so it is worth a call to see if yours does. If you don't know where your local health department is, you can check in your phone book. The CDC also has a tool to help you locate your state's health department, and many state websites also have listings of all of their local health departments.  3) Find a Walk-in Clinic If your illness is not likely to be very severe or complicated, you may want to try a walk in clinic. These are popping up all over the country in pharmacies, drugstores, and shopping centers. They're usually staffed by nurse practitioners or physician assistants that have been trained to treat common illnesses and complaints. They're usually fairly quick and inexpensive. However, if you have serious medical issues or chronic medical problems, these are probably not your best option.  No Primary Care Doctor: - Call Health Connect at  [phone] - they can help you locate a primary care doctor that  accepts your insurance, provides certain services, etc. - Physician Referral Service- [phone]  Chronic Pain Problems: Organization          Address  Phone   Notes  [facility]  [phone] Patients need to be referred by their primary care doctor.   Medication Assistance: Organization         Address  Phone   Notes  [facility] Medication [facility] [address] [place], [place] [address] [phone] --Must be a resident of [facility] -- Must have NO insurance coverage whatsoever (no Medicaid/ Medicare, etc.) -- The pt. MUST have a primary care doctor that directs their care regularly and follows them in the community   [facility]ist  [phone]   [employer]  [phone]    Agencies that provide inexpensive medical care: Organization         Address  Phone   Notes  [facility]  [phone]   [facility]    [phone]   [facility] [address] [place], [place] [address] [phone]   [facility] [address] [place], [place] [phone]   Planned Parenthood    [phone]   [facility]    [phone]   [facility]  [address], [place] Phone:  [phone], Fax:  [phone] Hours of Operation:  9 am - 6 pm, M-F.  Also accepts Medicaid/Medicare and self-pay.  [facility]  [address], [place] Phone: [phone], Fax: [phone]. Hours of Operation:  8:30 am - 5:30 pm, M-F.  Also accepts Medicaid and self-pay.  [facility] [place] [address], [place] Phone: [phone]   [facility] [address] Natasha Bence [place], [place] [phone], Ext. [phone] Mondays & Thursdays: 7-9 AM.  First 15 patients are seen on a first come, first serve basis.    Medicaid-accepting [facility] Providers:  Organization         Address  Phone   Notes  [facility] [address], [place] [phone] Also accepts self-pay patients.  [facility] [address] Laurell Josephs [place], [place]  [phone]   [facility] [address], [place] [phone]   [facility] [address] [place] [phone]   Renaye Rakers [address], [place]   [phone] Only accepts [place] patients after they have their name applied to their card.   Self-Pay (no insurance) in [facility]:  Organization         Address  Phone   Notes  Sickle Cell Patients, [facility] [address] [place], [place] [phone]   [facility] [address] [place], [place] [phone]   [facility]  [address], [place] [phone]   [facility]/Dr. Osei-Bonsu  [address], [place] or [address], [place] [phone] Phone number for both [place] and [place] locations is the same.  [facility] [address] [place] [phone]   [facility] [address] [place] or [address] [phone]   [facility] [address], [place] [phone], phone; [phone], fax Sees patients 1st and 3rd Saturday of every month.  Must not qualify for public or private insurance (i.e. Medicaid, Medicare, [place] Health Choice, Veterans' Benefits)  Household income should be no more than 200% of the poverty level The clinic cannot treat you if you are pregnant or think you are pregnant  Sexually transmitted diseases are not treated at the clinic.    Dental Care: Organization         Address  Phone  Notes  [facility] [address] [place], [place] [phone] Accepts children up to age 46 who are enrolled in [place] or [place] Health Choice; pregnant women with a Medicaid card; and  children who have applied for Medicaid or [place] Health Choice, but were declined, whose parents can pay a reduced fee at time of service.  [facility] Department of [facility]  [address], [place] [phone] Accepts children up to age 68 who are enrolled in [place] or [place] Health Choice; pregnant women with a Medicaid card; and children who have applied for Medicaid or [place] Health Choice, but were declined, whose parents can pay a reduced fee at time of service.  [facility]  [address] [place], [place] [phone] Patients are seen by appointment only. Walk-ins are not accepted. [facility] will see patients 26 years of age and older. Monday - Tuesday (8am-5pm) Most Wednesdays (8:30-5pm) $30 per visit, cash only  [facility]  [address], [facility] [phone] Patients are seen by appointment only. Walk-ins are not accepted. [facility] will see patients 74 years of age and older. One Wednesday Evening (Monthly: Volunteer Based).  $30 per visit, cash only  [employer]  [phone] for adults; Children under age 66, call Graduate Pediatric Dentistry at [phone]. Children aged 66-14, please call [phone] to request a pediatric application.  Dental services are provided in all areas of dental care including fillings, crowns and bridges, complete and partial dentures, implants, gum treatment, root canals, and extractions. Preventive care is also provided. Treatment is provided to both adults and children. Patients are selected via a lottery and there is often a waiting list.   [facility] [address], [place]  [phone] [email or web address]   [facility] [address] [place], [place] [phone], Ext. [phone] Second and Fourth Thursday of each month, opens at 6:30 AM; Clinic ends at 9 AM.  Patients are seen on a first-come first-served  basis, and a limited number are seen during each clinic.   [facility]  [address] Ether Griffins [place], [place] [phone]   Eligibility Requirements You must have lived in [place], [place], or [place] counties for at least the last three months.   You cannot be eligible for state or federal sponsored [place], including [employer], [place], or [employer].   You generally cannot be eligible for healthcare insurance through your employer.    How to apply: Eligibility screenings are held every Tuesday and Wednesday afternoon from 1:00 pm until 4:00 pm. You do not need an appointment for the interview!  [facility] [address] [place], [place] [phone]   [facility] Health Department  [phone]   [facility] Health Department  [phone]   [facility] [address] Health Department  [phone]    Behavioral Health Resources in the Community: Intensive Outpatient Programs Organization         Address  Phone  Notes  [facility] Services [address] [place], [place] [phone]   [facility] [address] [place], [place] [phone]   [facility] [address] [place], [place]  [phone]   [facility] Mental Health [address]  [place], [place] [phone] or [phone]   Substance Abuse Resources Organization         Address  Phone  Notes  [facility] Drug [facility]  [phone]   [facility]  [phone]   [facility] [place]  [phone]   Floydene Flock  [phone]   Residential & Outpatient Substance Abuse Program  [phone]   Psychological Services Organization         Address  Phone  Notes  [facility]  [phone]- [phone]   [facility]  [phone]- [phone]   [facility] [address], [place] [phone] or [phone]    Mobile Crisis Teams Organization          Address  Phone  Notes  [facility], Mobile Crisis Care Unit  [phone]   [facility]  [address] [place], [place] [phone]   Doristine LocksSharon DeEsch [address] [place] [phone]    Self-Help/Support Groups Organization         Address  Phone             Notes  [facility] [place] - variety of support groups  [phone] Call for more information  Narcotics Anonymous (NA), [facility] [address], [place]  2 meetings at this location   Statisticianesidential Treatment Programs Organization         Address  Phone  Notes  [facility] [address],    [place]  [phone]   [facility]  [address], [place] [phone], [place], [place] [phone]   [facility] [address], [place] [phone] Admissions: 8am-3pm M-F  [facility] [address]    [place], [place] [phone]   [facility] [address], [place], [place] [phone]   [facility] [address]  [place], [place] [phone]   [facility] - Intensive Outpatient [address], [place], [place] [phone]   [facility] ([facility] [address]  [place], [place] [phone] or [phone]   [facility] ([facility]) [address] [place], [place] [phone] Accepts Medicaid  [facility] [address]  [place] [phone] Substance Abuse/Addiction Treatment   [facility]al Health Resources Organization         Address  Phone  Notes  [facility]  [phone]   Angie FavaJulie Brannon, PhD [address] [place], [place]   [phone] or [phone](336) [phone]   [facility]   [address] [place], [place] [phone]) [phone]   [facility] [address], [place], [place] [phone]) [phone] Insurance/Medicaid/sponsorship  through [facility]  [facility] [address]                                    [place], [place] [phone]) [phone] Therapy/tele-psych/case  [facility] [address]   [place], [place] [phone](336) [phone]    Dr. Lolly MustacheArfeen  [phone]   [facility]  [employer] [facility] 1) [address], [place] 2) [address], [place] 3)  [address], [place] [phone]  [phone]) [phone]   [facility] Child Abuse Hotline [phone]) [phone] or [phone](336) [phone] (After Hours)

## 2016-11-01 ENCOUNTER — Emergency Department (HOSPITAL_COMMUNITY): Payer: Worker's Compensation

## 2016-11-01 ENCOUNTER — Emergency Department (HOSPITAL_COMMUNITY)
Admission: EM | Admit: 2016-11-01 | Discharge: 2016-11-01 | Disposition: A | Discharge status: Home / Self Care | Payer: Worker's Compensation | Attending: Emergency Medicine | Admitting: Emergency Medicine

## 2016-11-01 ENCOUNTER — Encounter (HOSPITAL_COMMUNITY): Payer: Self-pay | Admitting: Emergency Medicine

## 2016-11-01 ENCOUNTER — Emergency Department (HOSPITAL_COMMUNITY)
Admission: EM | Admit: 2016-11-01 | Discharge: 2016-11-01 | Discharge status: Absent without leave | Payer: No Typology Code available for payment source

## 2016-11-01 DIAGNOSIS — S79911A Unspecified injury of right hip, initial encounter: Secondary | ICD-10-CM | POA: Diagnosis present

## 2016-11-01 DIAGNOSIS — Y929 Unspecified place or not applicable: Secondary | ICD-10-CM | POA: Diagnosis not present

## 2016-11-01 DIAGNOSIS — I10 Essential (primary) hypertension: Secondary | ICD-10-CM | POA: Diagnosis not present

## 2016-11-01 DIAGNOSIS — Y939 Activity, unspecified: Secondary | ICD-10-CM | POA: Insufficient documentation

## 2016-11-01 DIAGNOSIS — S7001XA Contusion of right hip, initial encounter: Secondary | ICD-10-CM | POA: Diagnosis not present

## 2016-11-01 DIAGNOSIS — W010XXA Fall on same level from slipping, tripping and stumbling without subsequent striking against object, initial encounter: Secondary | ICD-10-CM | POA: Diagnosis not present

## 2016-11-01 DIAGNOSIS — Y999 Unspecified external cause status: Secondary | ICD-10-CM | POA: Diagnosis not present

## 2016-11-01 MED ORDER — METHOCARBAMOL 500 MG PO TABS: 500.0000 mg | ORAL_TABLET | Freq: Two times a day (BID) | ORAL | 0 refills | Status: DC | PRN

## 2016-11-01 MED ORDER — KETOROLAC TROMETHAMINE 30 MG/ML IJ SOLN
30.0000 mg | Freq: Once | INTRAMUSCULAR | Status: AC | PRN
  Administered 2016-11-01: 30 mg via INTRAMUSCULAR
  Filled 2016-11-01: qty 1

## 2016-11-01 MED ORDER — IBUPROFEN 800 MG PO TABS: 800.0000 mg | ORAL_TABLET | Freq: Three times a day (TID) | ORAL | 0 refills | Status: DC | PRN

## 2016-11-01 MED ORDER — MORPHINE SULFATE (PF) 4 MG/ML IV SOLN
4.0000 mg | Freq: Once | INTRAVENOUS | Status: AC
  Administered 2016-11-01: 4 mg via INTRAVENOUS
  Filled 2016-11-01: qty 1

## 2016-11-01 MED ORDER — METHOCARBAMOL 500 MG PO TABS
500.0000 mg | ORAL_TABLET | Freq: Once | ORAL | Status: AC
  Administered 2016-11-01: 500 mg via ORAL
  Filled 2016-11-01: qty 1

## 2016-11-01 MED ORDER — OXYCODONE-ACETAMINOPHEN 5-325 MG PO TABS: 1.0000 | ORAL_TABLET | Freq: Four times a day (QID) | ORAL | 0 refills | Status: DC | PRN

## 2016-11-01 NOTE — ED Provider Notes (Signed)
[facility]-EMERGENCY DEPT Provider Note   CSN: [id number] Arrival date & time: 11/01/16  1448     History   Chief Complaint Chief Complaint  Patient presents with  . Fall    HPI Brett Hamilton is a 40 y.o. male.  The history is provided by the patient and medical records. No language interpreter was used.  Fall  Pertinent negatives include no chest pain, no abdominal pain, no headaches and no shortness of breath.   Brett Hamilton is a [age] y.o. male  with a PMH of HTN, arthritis who presents to the Emergency Department complaining of right lower back and hip pain after a mechanical fall just prior to arrival. Patient states he slipped, falling backwards onto his right hip. 100mcg fentanyl given by EMS prior to arrival with improvement of pain. No other medications taken. No history of prior back/hip injuries. Denies upper back or neck pain. No head injury or LOC. Not on blood thinners. Denies saddle anesthesia, weakness, numbness, tingling, urinary complaints including retention/incontinence. No history of cancer, IVDU, or recent spinal procedures.    Past Medical History:  Diagnosis Date  . Arthritis   . History of kidney stones   . Hypertension     Patient Active Problem List   Diagnosis Date Noted  . Chest pain 03/05/2014  . HTN (hypertension) 03/05/2014    Past Surgical History:  Procedure Laterality Date  . CHOLECYSTECTOMY    . NEPHROLITHOTOMY Left 03/30/2013   Procedure: NEPHROLITHOTOMY PERCUTANEOUS;  Surgeon: Valetta Fulleravid S Grapey, MD;  Location: [facility];  Service: Urology;  Laterality: Left;       Home Medications    Prior to Admission medications   Medication Sig Start Date End Date Taking? Authorizing Provider  lisinopril-hydrochlorothiazide (PRINZIDE,ZESTORETIC) 20-12.5 MG per tablet Take 1 tablet by mouth daily. 03/06/14  Yes Rodolph Bonganiel V Thompson, MD  Multiple Vitamin (ONE-A-DAY MENS PO) Take 1 tablet by mouth daily.   Yes Historical Provider, MD  amLODipine (NORVASC) 10  MG tablet Take 1 tablet (10 mg total) by mouth daily. Patient not taking: Reported on 11/01/2016 03/06/14   Rodolph Bonganiel V Thompson, MD  atorvastatin (LIPITOR) 20 MG tablet Take 1 tablet (20 mg total) by mouth daily at 6 PM. Patient not taking: Reported on 11/01/2016 03/06/14   Rodolph Bonganiel V Thompson, MD  ibuprofen (ADVIL,MOTRIN) 800 MG tablet Take 1 tablet (800 mg total) by mouth every 8 (eight) hours as needed. 11/01/16   Chase PicketJaime Pilcher Ward, PA-C  methocarbamol (ROBAXIN) 500 MG tablet Take 1 tablet (500 mg total) by mouth 2 (two) times daily as needed for muscle spasms. 11/01/16   Chase PicketJaime Pilcher Ward, PA-C  nitroGLYCERIN (NITROSTAT) 0.4 MG SL tablet Place 1 tablet (0.4 mg total) under the tongue every 5 (five) minutes as needed for chest pain (CP or SOB). Patient not taking: Reported on 11/01/2016 03/06/14   Rodolph Bonganiel V Thompson, MD  oxyCODONE-acetaminophen (PERCOCET/ROXICET) 5-325 MG tablet Take 1-2 tablets by mouth every 6 (six) hours as needed for severe pain. 11/01/16   Chase PicketJaime Pilcher Ward, PA-C    Family History Family History  Problem Relation Age of Onset  . Hypertension Mother     Social History Social History  Substance Use Topics  . Smoking status: Never Smoker  . Smokeless tobacco: Never Used  . Alcohol use No     Allergies   Patient has no known allergies.   Review of Systems Review of Systems  Constitutional: Negative for appetite change.  HENT: Negative for congestion.  Eyes: Negative for visual disturbance.  Respiratory: Negative for shortness of breath.   Cardiovascular: Negative for chest pain.  Gastrointestinal: Negative for abdominal pain, nausea and vomiting.  Genitourinary: Negative for dysuria.  Musculoskeletal: Positive for back pain. Negative for neck pain.  Skin: Negative for wound.  Neurological: Negative for syncope, weakness, numbness and headaches.     Physical Exam Updated Vital Signs BP 149/87 (BP Location: Left Arm)   Pulse 96   Resp 18   Ht 6' (1.829 m)    Wt 131.1 kg   SpO2 99%   BMI 39.20 kg/m   Physical Exam  Constitutional: He is oriented to person, place, and time. He appears well-developed and well-nourished. No distress.  HENT:  Head: Normocephalic and atraumatic.  Neck:  Full ROM without pain No midline tenderness No tenderness of paraspinal musculature  Cardiovascular: Normal rate, regular rhythm, normal heart sounds and intact distal pulses.   No murmur heard. Pulmonary/Chest: Effort normal and breath sounds normal. No respiratory distress. He has no wheezes. He has no rales.  Abdominal: Soft. Bowel sounds are normal. He exhibits no distension. There is no tenderness.  Musculoskeletal:       Back:  TTP as depicted in image. Mild midline lumbar tenderness, but much more severe right paraspinal and hip. Decreased ROM 2/2 pain. No noted deformities or signs of inflammation. No overlying skin changes. Curvature of cervical, thoracic, and lumbar spine within normal limits.Straight leg raises are negative on bilaterally for radicular symptoms. 5/5 muscle strength of bilateral LE's   Neurological: He is alert and oriented to person, place, and time. He has normal reflexes.  Bilateral lower extremities neurovascularly intact.  Skin: Skin is warm and dry. No rash noted. No erythema.  Nursing note and vitals reviewed.    ED Treatments / Results  Labs (all labs ordered are listed, but only abnormal results are displayed) Labs Reviewed - No data to display  EKG  EKG Interpretation None       Radiology Dg Lumbar Spine Complete  Result Date: 11/01/2016 CLINICAL DATA:  Slipped and fell with pain in the right hip and low back EXAM: LUMBAR SPINE - COMPLETE 4+ VIEW COMPARISON:  None. FINDINGS: The lumbar vertebrae are in normal alignment. There is degenerative disc disease at the L5-S1 level where there is loss of disc space, sclerosis, spurring, and vacuum disc phenomenon. The remainder of intervertebral disc spaces appear normal.  No compression deformity is seen. No pars defect is noted. A left lower pole renal calculus is noted of approximately 9 mm in diameter. The bowel gas pattern is nonspecific. IMPRESSION: 1. Degenerative disc disease at L5-S1.  No acute abnormality. 2. Left lower pole renal calculus. Electronically Signed   By: Dwyane DeePaul  Barry M.D.   On: 11/01/2016 16:36   Dg Hip Unilat W Or Wo Pelvis 2-3 Views Right  Result Date: 11/01/2016 CLINICAL DATA:  Larey SeatFell today with pain in the right hip EXAM: DG HIP (WITH OR WITHOUT PELVIS) 2-3V RIGHT COMPARISON:  None. FINDINGS: Both hip joint spaces appear normal. No fracture is seen. The pelvic rami are intact. The SI joints are corticated. No acute abnormality is seen. IMPRESSION: Negative. Electronically Signed   By: Dwyane DeePaul  Barry M.D.   On: 11/01/2016 16:35    Procedures Procedures (including critical care time)  Medications Ordered in ED Medications  methocarbamol (ROBAXIN) tablet 500 mg (500 mg Oral Given 11/01/16 1539)  ketorolac (TORADOL) 30 MG/ML injection 30 mg (30 mg Intramuscular Given 11/01/16 1742)  morphine  4 MG/ML injection 4 mg (4 mg Intravenous Given 11/01/16 1741)     Initial Impression / Assessment and Plan / ED Course  I have reviewed the triage vital signs and the nursing notes.  Pertinent labs & imaging results that were available during my care of the patient were reviewed by me and considered in my medical decision making (see chart for details).  Clinical Course    Brett Hamilton is a 39 y.o. male who presents to ED for right hip pain after mechanical fall. Bilateral lower extremities are neurovascularly intact. No red flag symptoms of back pain. X-rays obtained which were negative for acute injury. Pain controlled in ED. No emesis while in ED. Patient was able to ambulate without assistance around the ER today. He feels able to go home with symptomatic management. Home care instructions and reasons to return to ER discussed. All questions answered.     Final Clinical Impressions(s) / ED Diagnoses   Final diagnoses:  Contusion of right hip, initial encounter    New Prescriptions New Prescriptions   IBUPROFEN (ADVIL,MOTRIN) 800 MG TABLET    Take 1 tablet (800 mg total) by mouth every 8 (eight) hours as needed.   METHOCARBAMOL (ROBAXIN) 500 MG TABLET    Take 1 tablet (500 mg total) by mouth 2 (two) times daily as needed for muscle spasms.   OXYCODONE-ACETAMINOPHEN (PERCOCET/ROXICET) 5-325 MG TABLET    Take 1-2 tablets by mouth every 6 (six) hours as needed for severe pain.     Chase Picket Ward, PA-C 11/01/16 1805    Laurence Spates, MD 11/02/16 [phone]

## 2016-11-01 NOTE — ED Notes (Signed)
Bed: [facility] Expected date:  Expected time:  Means of arrival:  Comments: 

## 2016-11-01 NOTE — ED Triage Notes (Signed)
Per EMS, pt slipped and fell from a crouching position and is having pain in the right, lower back.  Pt denies hitting head or loss of consciousness. Pt is not on blood thinners.  18G IV in right hand, received 100 fentanyl with EMS.  BP: 150/110

## 2016-11-01 NOTE — Discharge Instructions (Signed)
Robaxin is your muscle relaxer. Ibuprofen as needed for mild to moderate pain. Pain medication only as needed for severe pain - This can make you very drowsy - please do not drink alcohol, operate heavy machinery or drive on this medication. Follow up with your primary care provider if symptoms are not improved in 1 week. Return to ER for numbness/tingling, weakness of your legs, new or worsening symptoms, any additional concerns.   COLD THERAPY DIRECTIONS:  Ice or gel packs can be used to reduce both pain and swelling. Ice is the most helpful within the first 24 to 48 hours after an injury or flareup from overusing a muscle or joint.  Ice is effective, has very few side effects, and is safe for most people to use.   If you expose your skin to cold temperatures for too long or without the proper protection, you can damage your skin or nerves. Watch for signs of skin damage due to cold.   HOME CARE INSTRUCTIONS  Follow these tips to use ice and cold packs safely.  Place a dry or damp towel between the ice and skin. A damp towel will cool the skin more quickly, so you may need to shorten the time that the ice is used.  For a more rapid response, add gentle compression to the ice.  Ice for no more than 10 to 20 minutes at a time. The bonier the area you are icing, the less time it will take to get the benefits of ice.  Check your skin after 5 minutes to make sure there are no signs of a poor response to cold or skin damage.  Rest 20 minutes or more in between uses.  Once your skin is numb, you can end your treatment. You can test numbness by very lightly touching your skin. The touch should be so light that you do not see the skin dimple from the pressure of your fingertip. When using ice, most people will feel these normal sensations in this order: cold, burning, aching, and numbness.

## 2017-02-28 ENCOUNTER — Emergency Department (HOSPITAL_COMMUNITY): Payer: Self-pay

## 2017-02-28 ENCOUNTER — Encounter (HOSPITAL_COMMUNITY): Payer: Self-pay | Admitting: Emergency Medicine

## 2017-02-28 ENCOUNTER — Emergency Department (HOSPITAL_COMMUNITY)
Admission: EM | Admit: 2017-02-28 | Discharge: 2017-02-28 | Disposition: A | Discharge status: Home / Self Care | Payer: Self-pay | Attending: Emergency Medicine | Admitting: Emergency Medicine

## 2017-02-28 DIAGNOSIS — I10 Essential (primary) hypertension: Secondary | ICD-10-CM | POA: Insufficient documentation

## 2017-02-28 DIAGNOSIS — R519 Headache, unspecified: Secondary | ICD-10-CM

## 2017-02-28 DIAGNOSIS — R51 Headache: Secondary | ICD-10-CM

## 2017-02-28 DIAGNOSIS — Z79899 Other long term (current) drug therapy: Secondary | ICD-10-CM | POA: Insufficient documentation

## 2017-02-28 LAB — COMPREHENSIVE METABOLIC PANEL
ALT: 40 U/L (ref 17–63)
AST: 34 U/L (ref 15–41)
Albumin: 3.7 g/dL (ref 3.5–5.0)
Alkaline Phosphatase: 78 U/L (ref 38–126)
Anion gap: 6 (ref 5–15)
BUN: 16 mg/dL (ref 6–20)
CHLORIDE: 106 mmol/L (ref 101–111)
CO2: 25 mmol/L (ref 22–32)
Calcium: 9.2 mg/dL (ref 8.9–10.3)
Creatinine, Ser: 1.06 mg/dL (ref 0.61–1.24)
GFR calc non Af Amer: >60 mL/min (ref >60)
Glucose, Bld: 163 mg/dL — ABNORMAL HIGH (ref 65–99)
Potassium: 3.7 mmol/L (ref 3.5–5.1)
SODIUM: 137 mmol/L (ref 135–145)
Total Bilirubin: 0.8 mg/dL (ref 0.3–1.2)
Total Protein: 7.8 g/dL (ref 6.5–8.1)

## 2017-02-28 LAB — URINALYSIS, ROUTINE W REFLEX MICROSCOPIC
BACTERIA UA: NONE SEEN
BILIRUBIN URINE: NEGATIVE
Glucose, UA: 50 mg/dL — AB
Ketones, ur: NEGATIVE mg/dL
LEUKOCYTES UA: NEGATIVE
Nitrite: NEGATIVE
Protein, ur: 100 mg/dL — AB
Specific Gravity, Urine: 1.015 (ref 1.005–1.030)
pH: 6 (ref 5.0–8.0)

## 2017-02-28 LAB — CBC WITH DIFFERENTIAL/PLATELET
BASOS ABS: 0 10*3/uL (ref 0.0–0.1)
BASOS PCT: 0 %
EOS ABS: 0.2 10*3/uL (ref 0.0–0.7)
EOS PCT: 2 %
HEMATOCRIT: 46.7 % (ref 39.0–52.0)
Hemoglobin: 16 g/dL (ref 13.0–17.0)
Lymphocytes Relative: 21 %
Lymphs Abs: 2.5 10*3/uL (ref 0.7–4.0)
MCH: 29 pg (ref 26.0–34.0)
MCHC: 34.3 g/dL (ref 30.0–36.0)
MCV: 84.6 fL (ref 78.0–100.0)
MONO ABS: 0.7 10*3/uL (ref 0.1–1.0)
Monocytes Relative: 6 %
Neutro Abs: 8.6 10*3/uL — ABNORMAL HIGH (ref 1.7–7.7)
Neutrophils Relative %: 71 %
PLATELETS: 237 10*3/uL (ref 150–400)
RBC: 5.52 MIL/uL (ref 4.22–5.81)
RDW: 13.4 % (ref 11.5–15.5)
WBC: 12.1 10*3/uL — ABNORMAL HIGH (ref 4.0–10.5)

## 2017-02-28 LAB — LIPASE, BLOOD: LIPASE: 56 U/L — AB (ref 11–51)

## 2017-02-28 MED ORDER — ONDANSETRON HCL 4 MG/2ML IJ SOLN
4.0000 mg | Freq: Once | INTRAMUSCULAR | Status: AC
  Administered 2017-02-28: 4 mg via INTRAVENOUS
  Filled 2017-02-28: qty 2

## 2017-02-28 MED ORDER — LISINOPRIL 10 MG PO TABS
10.0000 mg | ORAL_TABLET | Freq: Once | ORAL | Status: AC
  Administered 2017-02-28: 10 mg via ORAL
  Filled 2017-02-28 (×2): qty 1

## 2017-02-28 MED ORDER — CLONIDINE HCL 0.1 MG PO TABS
0.2000 mg | ORAL_TABLET | Freq: Once | ORAL | Status: AC
  Administered 2017-02-28: 0.2 mg via ORAL
  Filled 2017-02-28: qty 2

## 2017-02-28 MED ORDER — SODIUM CHLORIDE 0.9 % IV SOLN
Freq: Once | INTRAVENOUS | Status: AC
  Administered 2017-02-28: 250 mL/h via INTRAVENOUS

## 2017-02-28 MED ORDER — NAPROXEN 500 MG PO TABS: 500.0000 mg | ORAL_TABLET | Freq: Two times a day (BID) | ORAL | 0 refills | Status: DC

## 2017-02-28 MED ORDER — HYDROCHLOROTHIAZIDE 12.5 MG PO CAPS
25.0000 mg | ORAL_CAPSULE | Freq: Once | ORAL | Status: AC
  Administered 2017-02-28: 25 mg via ORAL
  Filled 2017-02-28: qty 2

## 2017-02-28 MED ORDER — LISINOPRIL-HYDROCHLOROTHIAZIDE 10-12.5 MG PO TABS: 1.0000 | ORAL_TABLET | Freq: Every day | ORAL | 1 refills | Status: DC

## 2017-02-28 MED ORDER — IBUPROFEN 800 MG PO TABS
800.0000 mg | ORAL_TABLET | Freq: Once | ORAL | Status: AC
  Administered 2017-02-28: 800 mg via ORAL
  Filled 2017-02-28: qty 1

## 2017-02-28 MED ORDER — HYDROMORPHONE HCL 1 MG/ML IJ SOLN
1.0000 mg | Freq: Once | INTRAMUSCULAR | Status: AC
  Administered 2017-02-28: 1 mg via INTRAVENOUS
  Filled 2017-02-28: qty 1

## 2017-02-28 MED ORDER — ACETAMINOPHEN 500 MG PO TABS
1000.0000 mg | ORAL_TABLET | Freq: Once | ORAL | Status: AC
  Administered 2017-02-28: 1000 mg via ORAL
  Filled 2017-02-28: qty 2

## 2017-02-28 NOTE — ED Notes (Signed)
Patient transported to CT 

## 2017-02-28 NOTE — ED Provider Notes (Signed)
[facility]ER[facility] DEPT Provider Note   CSN: [id number] Arrival date & time: 02/28/17  0917     History   Chief Complaint Chief Complaint  Patient presents with  . Headache    HPI Brett Hamilton is a 41 y.o. male.  Patient presents with right-sided headache for 3 days without any other neurological deficits. He has been noncompliant with his blood pressure medications which allegedly included Norvasc and Lopressor. Review systems positive for minimal abdominal pain. He has been eating without vomiting or diarrhea. He does not have a primary care relationship.      Past Medical History:  Diagnosis Date  . Arthritis   . History of kidney stones   . Hypertension     Patient Active Problem List   Diagnosis Date Noted  . Chest pain 03/05/2014  . HTN (hypertension) 03/05/2014    Past Surgical History:  Procedure Laterality Date  . CHOLECYSTECTOMY    . NEPHROLITHOTOMY Left 03/30/2013   Procedure: NEPHROLITHOTOMY PERCUTANEOUS;  Surgeon: Valetta Fuller, MD;  Location: [facility];  Service: Urology;  Laterality: Left;       Home Medications    Prior to Admission medications   Medication Sig Start Date End Date Taking? Authorizing Provider  Multiple Vitamin (ONE-A-DAY MENS PO) Take 1 tablet by mouth daily.   Yes Historical Provider, MD  amLODipine (NORVASC) 10 MG tablet Take 1 tablet (10 mg total) by mouth daily. Patient not taking: Reported on 11/01/2016 03/06/14   Rodolph Bong, MD  atorvastatin (LIPITOR) 20 MG tablet Take 1 tablet (20 mg total) by mouth daily at 6 PM. Patient not taking: Reported on 11/01/2016 03/06/14   Rodolph Bong, MD  ibuprofen (ADVIL,MOTRIN) 800 MG tablet Take 1 tablet (800 mg total) by mouth every 8 (eight) hours as needed. Patient not taking: Reported on 02/28/2017 11/01/16   [facility] Ward, PA-C  lisinopril-hydrochlorothiazide (ZESTORETIC) 10-12.5 MG tablet Take 1 tablet by mouth daily. 02/28/17   Donnetta Hutching, MD  methocarbamol (ROBAXIN) 500 MG  tablet Take 1 tablet (500 mg total) by mouth 2 (two) times daily as needed for muscle spasms. Patient not taking: Reported on 02/28/2017 11/01/16   [facility] Ward, PA-C  naproxen (NAPROSYN) 500 MG tablet Take 1 tablet (500 mg total) by mouth 2 (two) times daily. 02/28/17   Donnetta Hutching, MD  nitroGLYCERIN (NITROSTAT) 0.4 MG SL tablet Place 1 tablet (0.4 mg total) under the tongue every 5 (five) minutes as needed for chest pain (CP or SOB). Patient not taking: Reported on 11/01/2016 03/06/14   Rodolph Bong, MD  oxyCODONE-acetaminophen (PERCOCET/ROXICET) 5-325 MG tablet Take 1-2 tablets by mouth every 6 (six) hours as needed for severe pain. Patient not taking: Reported on 02/28/2017 11/01/16   Chase Picket Ward, PA-C    Family History Family History  Problem Relation Age of Onset  . Hypertension Mother     Social History Social History  Substance Use Topics  . Smoking status: Never Smoker  . Smokeless tobacco: Never Used  . Alcohol use No     Allergies   Patient has no known allergies.   Review of Systems Review of Systems  All other systems reviewed and are negative.    Physical Exam Updated Vital Signs BP (!) 173/111   Pulse 78   Temp 98.3 F (36.8 C) (Oral)   Resp 18   Ht 6' (1.829 m)   Wt 255 lb (115.7 kg)   SpO2 95%   BMI 34.58 kg/m  Physical Exam  Constitutional: He is oriented to person, place, and time.  Obese, NAD  HENT:  Head: Normocephalic and atraumatic.  Eyes: Conjunctivae are normal.  Neck: Neck supple.  Cardiovascular: Normal rate and regular rhythm.   Pulmonary/Chest: Effort normal and breath sounds normal.  Abdominal: Soft. Bowel sounds are normal.  Musculoskeletal: Normal range of motion.  Neurological: He is alert and oriented to person, place, and time.  Skin: Skin is warm and dry.  Psychiatric: He has a normal mood and affect. His behavior is normal.  Nursing note and vitals reviewed.    ED Treatments / Results  Labs (all labs  ordered are listed, but only abnormal results are displayed) Labs Reviewed  CBC WITH DIFFERENTIAL/PLATELET - Abnormal; Notable for the following:       Result Value   WBC 12.1 (*)    Neutro Abs 8.6 (*)    All other components within normal limits  LIPASE, BLOOD - Abnormal; Notable for the following:    Lipase 56 (*)    All other components within normal limits  COMPREHENSIVE METABOLIC PANEL - Abnormal; Notable for the following:    Glucose, Bld 163 (*)    All other components within normal limits  URINALYSIS, ROUTINE W REFLEX MICROSCOPIC - Abnormal; Notable for the following:    Glucose, UA 50 (*)    Hgb urine dipstick SMALL (*)    Protein, ur 100 (*)    Squamous Epithelial / LPF 0-5 (*)    All other components within normal limits    EKG  EKG Interpretation None       Radiology Ct Head Wo Contrast  Result Date: 02/28/2017 CLINICAL DATA:  Frontal and temporal headaches for 3 days. Hypertension. EXAM: CT HEAD WITHOUT CONTRAST TECHNIQUE: Contiguous axial images were obtained from the base of the skull through the vertex without intravenous contrast. COMPARISON:  Head CT December 12, 2005; brain MRI December 12, 2005 FINDINGS: Brain: The ventricles are normal in size and configuration. There is no intracranial mass, hemorrhage, extra-axial fluid collection, or midline shift. Gray-white compartments appear normal. There is no evident acute infarct. Vascular: There is no hyperdense vessel. There is no appreciable vascular calcification. Skull: Bony calvarium appears intact. Sinuses/Orbits: There is mucosal thickening in several ethmoid air cells bilaterally. There is minimal mucosal thickening in each maxillary antrum. Other visualized paranasal sinuses are clear. Frontal sinuses are hypoplastic. There is rightward deviation of the nasal septum. There is left nasal turbinate edema with partial obstruction of the left naris. No intraorbital lesions are appreciable. Other: Mastoid air cells are  clear. IMPRESSION: Areas of mucosal thickening in several sinus regions. Rightward deviation nasal septum with narrowing of the left near ease due to nasal turbinate edema on the left. No intracranial mass, hemorrhage, or extra-axial fluid collection. Gray-white compartments appear normal. Electronically Signed   By: Bretta Bang III M.D.   On: 02/28/2017 10:27    Procedures Procedures (including critical care time)  Medications Ordered in ED Medications  lisinopril (PRINIVIL,ZESTRIL) tablet 10 mg (not administered)  hydrochlorothiazide (MICROZIDE) capsule 25 mg (not administered)  0.9 %  sodium chloride infusion (250 mL/hr Intravenous New Bag/Given 02/28/17 1004)  HYDROmorphone (DILAUDID) injection 1 mg (1 mg Intravenous Given 02/28/17 1005)  ondansetron (ZOFRAN) injection 4 mg (4 mg Intravenous Given 02/28/17 1003)  cloNIDine (CATAPRES) tablet 0.2 mg (0.2 mg Oral Given 02/28/17 1001)  acetaminophen (TYLENOL) tablet 1,000 mg (1,000 mg Oral Given 02/28/17 1256)  ibuprofen (ADVIL,MOTRIN) tablet 800 mg (800 mg Oral  Given 02/28/17 1256)     Initial Impression / Assessment and Plan / ED Course  I have reviewed the triage vital signs and the nursing notes.  Pertinent labs & imaging results that were available during my care of the patient were reviewed by me and considered in my medical decision making (see chart for details).     Patient's been noncompliant with his blood pressure medication. CT head negative. Will restart blood pressure medication on the [employer] 4 dollar list. Lisinopril/HCTZ 10/12.5.  He has been encouraged to get primary care follow-up.  Final Clinical Impressions(s) / ED Diagnoses   Final diagnoses:  Hypertension, unspecified type  Acute intractable headache, unspecified headache type    New Prescriptions New Prescriptions   LISINOPRIL-HYDROCHLOROTHIAZIDE (ZESTORETIC) 10-12.5 MG TABLET    Take 1 tablet by mouth daily.   NAPROXEN (NAPROSYN) 500 MG TABLET    Take 1  tablet (500 mg total) by mouth 2 (two) times daily.     Donnetta Hutching, MD 02/28/17 [phone]

## 2017-02-28 NOTE — ED Notes (Signed)
Pt cannot use restroom at this time, aware urine specimen is needed.  

## 2017-02-28 NOTE — Discharge Instructions (Signed)
Scan of your head was normal. You must get a primary care doctor. Prescription for blood pressure medicine and headache medicine

## 2017-02-28 NOTE — ED Triage Notes (Signed)
patient c./o frontal headache x 3 days. Patient has HTn but out of medications x 3 weeks.  Pt denies any nausea/vomiting or light sensitivity.

## 2017-09-11 ENCOUNTER — Emergency Department (HOSPITAL_COMMUNITY)
Admission: EM | Admit: 2017-09-11 | Discharge: 2017-09-11 | Disposition: A | Discharge status: Home / Self Care | Payer: Self-pay | Attending: Emergency Medicine | Admitting: Emergency Medicine

## 2017-09-11 ENCOUNTER — Encounter (HOSPITAL_COMMUNITY): Payer: Self-pay | Admitting: Emergency Medicine

## 2017-09-11 DIAGNOSIS — R51 Headache: Secondary | ICD-10-CM

## 2017-09-11 DIAGNOSIS — R519 Headache, unspecified: Secondary | ICD-10-CM

## 2017-09-11 DIAGNOSIS — I10 Essential (primary) hypertension: Secondary | ICD-10-CM | POA: Insufficient documentation

## 2017-09-11 MED ORDER — LISINOPRIL 20 MG PO TABS: 20.0000 mg | ORAL_TABLET | Freq: Every day | ORAL | 1 refills | Status: DC

## 2017-09-11 MED ORDER — SODIUM CHLORIDE 0.9 % IV BOLUS (SEPSIS)
1000.0000 mL | Freq: Once | INTRAVENOUS | Status: AC
  Administered 2017-09-11: 1000 mL via INTRAVENOUS

## 2017-09-11 MED ORDER — HYDROCHLOROTHIAZIDE 12.5 MG PO CAPS: 12.5000 mg | ORAL_CAPSULE | Freq: Every day | ORAL | 1 refills | Status: DC

## 2017-09-11 MED ORDER — KETOROLAC TROMETHAMINE 30 MG/ML IJ SOLN
30.0000 mg | Freq: Once | INTRAMUSCULAR | Status: AC
  Administered 2017-09-11: 30 mg via INTRAVENOUS
  Filled 2017-09-11: qty 1

## 2017-09-11 MED ORDER — HYDROCHLOROTHIAZIDE 12.5 MG PO CAPS
12.5000 mg | ORAL_CAPSULE | Freq: Once | ORAL | Status: AC
  Administered 2017-09-11: 12.5 mg via ORAL
  Filled 2017-09-11: qty 1

## 2017-09-11 MED ORDER — METOCLOPRAMIDE HCL 5 MG/ML IJ SOLN
10.0000 mg | Freq: Once | INTRAMUSCULAR | Status: AC
  Administered 2017-09-11: 10 mg via INTRAVENOUS
  Filled 2017-09-11: qty 2

## 2017-09-11 MED ORDER — DIPHENHYDRAMINE HCL 50 MG/ML IJ SOLN
25.0000 mg | Freq: Once | INTRAMUSCULAR | Status: AC
  Administered 2017-09-11: 25 mg via INTRAVENOUS
  Filled 2017-09-11: qty 1

## 2017-09-11 MED ORDER — LISINOPRIL 20 MG PO TABS
20.0000 mg | ORAL_TABLET | Freq: Once | ORAL | Status: AC
  Administered 2017-09-11: 20 mg via ORAL
  Filled 2017-09-11: qty 1

## 2017-09-11 MED ORDER — ACETAMINOPHEN 500 MG PO TABS
1000.0000 mg | ORAL_TABLET | Freq: Once | ORAL | Status: AC
  Administered 2017-09-11: 1000 mg via ORAL
  Filled 2017-09-11: qty 2

## 2017-09-11 NOTE — ED Provider Notes (Signed)
[facility]-EMERGENCY DEPT Provider Note   CSN: [id number] Arrival date & time: 09/11/17  [id number]     History   Chief Complaint Chief Complaint  Patient presents with  . Headache    HPI Brett Hamilton is a 41 y.o. male.  Fever, achiness, feeling cold, shaking, headache since 10 PM last night. No frank neurological deficits, stiff neck. He has been out of his blood pressure medicine for 3 days. He normally takes lisinopril 20 mg and hydrochlorothiazide 12.5 mg daily. No meningeal signs. Severity of pain is mild to moderate. Nothing makes symptoms better or worse.      Past Medical History:  Diagnosis Date  . Arthritis   . History of kidney stones   . Hypertension     Patient Active Problem List   Diagnosis Date Noted  . Chest pain 03/05/2014  . HTN (hypertension) 03/05/2014    Past Surgical History:  Procedure Laterality Date  . CHOLECYSTECTOMY    . NEPHROLITHOTOMY Left 03/30/2013   Procedure: NEPHROLITHOTOMY PERCUTANEOUS;  Surgeon: Valetta Fulleravid S Grapey, MD;  Location: [facility];  Service: Urology;  Laterality: Left;       Home Medications    Prior to Admission medications   Medication Sig Start Date End Date Taking? Authorizing Provider  acetaminophen (TYLENOL) 500 MG tablet Take 1,000 mg by mouth every 6 (six) hours as needed for mild pain, moderate pain, fever or headache.   Yes [provider]  lisinopril-hydrochlorothiazide (ZESTORETIC) 10-12.5 MG tablet Take 1 tablet by mouth daily. 02/28/17  Yes Donnetta Hutchingook, Ernesto Zukowski, MD  amLODipine (NORVASC) 10 MG tablet Take 1 tablet (10 mg total) by mouth daily. Patient not taking: Reported on 11/01/2016 03/06/14   Rodolph Bonghompson, Daniel V, MD  atorvastatin (LIPITOR) 20 MG tablet Take 1 tablet (20 mg total) by mouth daily at 6 PM. Patient not taking: Reported on 11/01/2016 03/06/14   Rodolph Bonghompson, Daniel V, MD  hydrochlorothiazide (MICROZIDE) 12.5 MG capsule Take 1 capsule (12.5 mg total) by mouth daily. 09/11/17   Donnetta Hutchingook,  Langston Summerfield, MD  lisinopril (PRINIVIL,ZESTRIL) 20 MG tablet Take 1 tablet (20 mg total) by mouth daily. 09/11/17   Donnetta Hutchingook, Meriam Chojnowski, MD  nitroGLYCERIN (NITROSTAT) 0.4 MG SL tablet Place 1 tablet (0.4 mg total) under the tongue every 5 (five) minutes as needed for chest pain (CP or SOB). Patient not taking: Reported on 11/01/2016 03/06/14   Rodolph Bonghompson, Daniel V, MD    Family History Family History  Problem Relation Age of Onset  . Hypertension Mother     Social History Social History  Substance Use Topics  . Smoking status: Never Smoker  . Smokeless tobacco: Never Used  . Alcohol use No     Allergies   Patient has no known allergies.   Review of Systems Review of Systems   Physical Exam Updated Vital Signs BP (!) 167/110   Pulse 75   Temp 99.1 F (37.3 C) (Oral)   Resp (!) 21   Ht 6' (1.829 m)   Wt 120.2 kg (265 lb)   SpO2 97%   BMI 35.94 kg/m   Physical Exam  Constitutional: He is oriented to person, place, and time. He appears well-developed and well-nourished.  HENT:  Head: Normocephalic and atraumatic.  Eyes: Conjunctivae are normal.  Neck: Neck supple.  Cardiovascular: Normal rate and regular rhythm.   Pulmonary/Chest: Effort normal and breath sounds normal.  Abdominal: Soft. Bowel sounds are normal.  Musculoskeletal: Normal range of motion.  Neurological: He is alert and oriented to person,  place, and time.  Skin: Skin is warm and dry.  Psychiatric: He has a normal mood and affect. His behavior is normal.  Nursing note and vitals reviewed.    ED Treatments / Results  Labs (all labs ordered are listed, but only abnormal results are displayed) Labs Reviewed - No data to display  EKG  EKG Interpretation None       Radiology No results found.  Procedures Procedures (including critical care time)  Medications Ordered in ED Medications  lisinopril (PRINIVIL,ZESTRIL) tablet 20 mg (not administered)  hydrochlorothiazide (MICROZIDE) capsule 12.5 mg (not  administered)  sodium chloride 0.9 % bolus 1,000 mL (1,000 mLs Intravenous New Bag/Given 09/11/17 0821)  ketorolac (TORADOL) 30 MG/ML injection 30 mg (30 mg Intravenous Given 09/11/17 4098)  metoCLOPramide (REGLAN) injection 10 mg (10 mg Intravenous Given 09/11/17 0824)  diphenhydrAMINE (BENADRYL) injection 25 mg (25 mg Intravenous Given 09/11/17 0828)  acetaminophen (TYLENOL) tablet 1,000 mg (1,000 mg Oral Given 09/11/17 0817)     Initial Impression / Assessment and Plan / ED Course  I have reviewed the triage vital signs and the nursing notes.  Pertinent labs & imaging results that were available during my care of the patient were reviewed by me and considered in my medical decision making (see chart for details).     Patient is neurologically intact. No evidence of meningitis. He felt better after IV fluids, IV Toradol, IV Reglan, IV Benadryl. Will refill his lisinopril 20 and hydrochlorothiazide 12.5 mg. Strongly encourage primary care follow-up for his blood pressure.  Final Clinical Impressions(s) / ED Diagnoses   Final diagnoses:  Hypertension, unspecified type  Acute intractable headache, unspecified headache type    New Prescriptions New Prescriptions   HYDROCHLOROTHIAZIDE (MICROZIDE) 12.5 MG CAPSULE    Take 1 capsule (12.5 mg total) by mouth daily.   LISINOPRIL (PRINIVIL,ZESTRIL) 20 MG TABLET    Take 1 tablet (20 mg total) by mouth daily.     Donnetta Hutching, MD 09/11/17 [phone]

## 2017-09-11 NOTE — Discharge Instructions (Signed)
Refill for your blood pressure medication. Try to get a primary care relationship in the community. Tylenol and/or ibuprofen for headache.

## 2017-09-11 NOTE — ED Triage Notes (Signed)
Patient complaining of headache and fever. Patient states he feels like his head is about to explode. Patient is complaining of being very cold. Patient has not taken any medication for his symptoms.

## 2017-09-15 ENCOUNTER — Telehealth (HOSPITAL_BASED_OUTPATIENT_CLINIC_OR_DEPARTMENT_OTHER): Payer: Self-pay | Admitting: *Deleted

## 2017-09-15 ENCOUNTER — Emergency Department (HOSPITAL_COMMUNITY): Payer: Self-pay

## 2017-09-15 ENCOUNTER — Emergency Department (HOSPITAL_COMMUNITY)
Admission: EM | Admit: 2017-09-15 | Discharge: 2017-09-15 | Disposition: A | Discharge status: Home / Self Care | Payer: Self-pay | Attending: Emergency Medicine | Admitting: Emergency Medicine

## 2017-09-15 ENCOUNTER — Encounter (HOSPITAL_COMMUNITY): Payer: Self-pay | Admitting: Emergency Medicine

## 2017-09-15 DIAGNOSIS — B349 Viral infection, unspecified: Secondary | ICD-10-CM | POA: Insufficient documentation

## 2017-09-15 DIAGNOSIS — R05 Cough: Secondary | ICD-10-CM | POA: Insufficient documentation

## 2017-09-15 DIAGNOSIS — K920 Hematemesis: Secondary | ICD-10-CM | POA: Insufficient documentation

## 2017-09-15 DIAGNOSIS — Z79899 Other long term (current) drug therapy: Secondary | ICD-10-CM | POA: Insufficient documentation

## 2017-09-15 DIAGNOSIS — L739 Follicular disorder, unspecified: Secondary | ICD-10-CM | POA: Insufficient documentation

## 2017-09-15 DIAGNOSIS — I10 Essential (primary) hypertension: Secondary | ICD-10-CM | POA: Insufficient documentation

## 2017-09-15 DIAGNOSIS — R059 Cough, unspecified: Secondary | ICD-10-CM

## 2017-09-15 DIAGNOSIS — R51 Headache: Secondary | ICD-10-CM | POA: Insufficient documentation

## 2017-09-15 LAB — COMPREHENSIVE METABOLIC PANEL
ALBUMIN: 3.3 g/dL — AB (ref 3.5–5.0)
ALK PHOS: 63 U/L (ref 38–126)
ALT: 58 U/L (ref 17–63)
AST: 55 U/L — ABNORMAL HIGH (ref 15–41)
Anion gap: 9 (ref 5–15)
BUN: 11 mg/dL (ref 6–20)
CHLORIDE: 101 mmol/L (ref 101–111)
CO2: 22 mmol/L (ref 22–32)
CREATININE: 1.32 mg/dL — AB (ref 0.61–1.24)
Calcium: 8.5 mg/dL — ABNORMAL LOW (ref 8.9–10.3)
GFR calc non Af Amer: >60 mL/min (ref >60)
GLUCOSE: 156 mg/dL — AB (ref 65–99)
Potassium: 4.1 mmol/L (ref 3.5–5.1)
SODIUM: 132 mmol/L — AB (ref 135–145)
Total Bilirubin: 0.6 mg/dL (ref 0.3–1.2)
Total Protein: 7.9 g/dL (ref 6.5–8.1)

## 2017-09-15 LAB — CBC WITH DIFFERENTIAL/PLATELET
Basophils Absolute: 0 10*3/uL (ref 0.0–0.1)
Basophils Relative: 0 %
Eosinophils Absolute: 0 10*3/uL (ref 0.0–0.7)
Eosinophils Relative: 0 %
HCT: 45.2 % (ref 39.0–52.0)
HEMOGLOBIN: 15.5 g/dL (ref 13.0–17.0)
LYMPHS ABS: 1.8 10*3/uL (ref 0.7–4.0)
LYMPHS PCT: 18 %
MCH: 28.8 pg (ref 26.0–34.0)
MCHC: 34.3 g/dL (ref 30.0–36.0)
MCV: 84 fL (ref 78.0–100.0)
Monocytes Absolute: 1.1 10*3/uL — ABNORMAL HIGH (ref 0.1–1.0)
Monocytes Relative: 12 %
NEUTROS PCT: 70 %
Neutro Abs: 7 10*3/uL (ref 1.7–7.7)
Platelets: 192 10*3/uL (ref 150–400)
RBC: 5.38 MIL/uL (ref 4.22–5.81)
RDW: 13.3 % (ref 11.5–15.5)
WBC: 10 10*3/uL (ref 4.0–10.5)

## 2017-09-15 LAB — URINALYSIS, ROUTINE W REFLEX MICROSCOPIC
Bacteria, UA: NONE SEEN
Bilirubin Urine: NEGATIVE
GLUCOSE, UA: NEGATIVE mg/dL
Ketones, ur: NEGATIVE mg/dL
Leukocytes, UA: NEGATIVE
Nitrite: NEGATIVE
PH: 6 (ref 5.0–8.0)
PROTEIN: 100 mg/dL — AB
Specific Gravity, Urine: 1.02 (ref 1.005–1.030)
Squamous Epithelial / LPF: NONE SEEN

## 2017-09-15 LAB — BLOOD CULTURE ID PANEL (REFLEXED)
Acinetobacter baumannii: NOT DETECTED
CANDIDA ALBICANS: NOT DETECTED
CANDIDA GLABRATA: NOT DETECTED
CANDIDA KRUSEI: NOT DETECTED
CANDIDA PARAPSILOSIS: NOT DETECTED
CANDIDA TROPICALIS: NOT DETECTED
ENTEROBACTER CLOACAE COMPLEX: NOT DETECTED
ESCHERICHIA COLI: NOT DETECTED
Enterobacteriaceae species: NOT DETECTED
Enterococcus species: NOT DETECTED
Haemophilus influenzae: NOT DETECTED
KLEBSIELLA OXYTOCA: NOT DETECTED
KLEBSIELLA PNEUMONIAE: NOT DETECTED
Listeria monocytogenes: NOT DETECTED
Methicillin resistance: NOT DETECTED
NEISSERIA MENINGITIDIS: NOT DETECTED
PROTEUS SPECIES: NOT DETECTED
Pseudomonas aeruginosa: NOT DETECTED
STAPHYLOCOCCUS SPECIES: DETECTED — AB
STREPTOCOCCUS PYOGENES: NOT DETECTED
Serratia marcescens: NOT DETECTED
Staphylococcus aureus (BCID): NOT DETECTED
Streptococcus agalactiae: NOT DETECTED
Streptococcus pneumoniae: NOT DETECTED
Streptococcus species: NOT DETECTED

## 2017-09-15 LAB — PROTIME-INR
INR: 1.02
Prothrombin Time: 13.3 seconds (ref 11.4–15.2)

## 2017-09-15 LAB — I-STAT CG4 LACTIC ACID, ED: Lactic Acid, Venous: 1.37 mmol/L (ref 0.5–1.9)

## 2017-09-15 MED ORDER — IBUPROFEN 800 MG PO TABS
800.0000 mg | ORAL_TABLET | Freq: Once | ORAL | Status: AC
  Administered 2017-09-15: 800 mg via ORAL
  Filled 2017-09-15: qty 1

## 2017-09-15 MED ORDER — ACETAMINOPHEN 325 MG PO TABS
ORAL_TABLET | ORAL | Status: AC
  Filled 2017-09-15: qty 2

## 2017-09-15 MED ORDER — ACETAMINOPHEN 325 MG PO TABS
650.0000 mg | ORAL_TABLET | Freq: Once | ORAL | Status: AC | PRN
  Administered 2017-09-15: 650 mg via ORAL

## 2017-09-15 MED ORDER — DOXYCYCLINE HYCLATE 100 MG PO TABS
100.0000 mg | ORAL_TABLET | Freq: Once | ORAL | Status: AC
  Administered 2017-09-15: 100 mg via ORAL
  Filled 2017-09-15: qty 1

## 2017-09-15 MED ORDER — DOXYCYCLINE HYCLATE 100 MG PO CAPS: 100.0000 mg | ORAL_CAPSULE | Freq: Two times a day (BID) | ORAL | 0 refills | Status: DC

## 2017-09-15 NOTE — ED Notes (Signed)
Pt given po fluids. 

## 2017-09-15 NOTE — ED Triage Notes (Signed)
Reports having fever, headache, and hematemesis for 2 days.  Has not taken any medications.

## 2017-09-15 NOTE — ED Provider Notes (Signed)
[facility] EMERGENCY DEPARTMENT Provider Note   CSN: [id number] Arrival date & time: 09/15/17  0229     History   Chief Complaint Chief Complaint  Patient presents with  . Fever  . Headache  . Hematemesis    HPI Brett Hamilton is a 41 y.o. male.  The history is provided by the patient.  Fever   This is a new problem. The current episode started 2 days ago. The problem occurs constantly. The problem has been gradually worsening. The maximum temperature noted was 101 to 101.9 F. Associated symptoms include headaches, muscle aches and cough. Pertinent negatives include no chest pain, no diarrhea, no vomiting and no sore throat.  Headache   This is a new problem. The current episode started 12 to 24 hours ago. The problem occurs constantly. The problem has been gradually improving. The pain is at a severity of 6/10. Associated symptoms include a fever. Pertinent negatives include no vomiting.   Patient reports onset of fever/chills 2 days ago He then developed cough He later developed HA His main concern is fever/chills No vomiting/diarrhea He also reports "bumps" to neck and legs No travel H/o HTN No tick bites Seen on 10/17 for HA and HTN but those were unrelated and not similar to today's headache  Past Medical History:  Diagnosis Date  . Arthritis   . History of kidney stones   . Hypertension     Patient Active Problem List   Diagnosis Date Noted  . Chest pain 03/05/2014  . HTN (hypertension) 03/05/2014    Past Surgical History:  Procedure Laterality Date  . CHOLECYSTECTOMY    . NEPHROLITHOTOMY Left 03/30/2013   Procedure: NEPHROLITHOTOMY PERCUTANEOUS;  Surgeon: Valetta Fuller, MD;  Location: [facility];  Service: Urology;  Laterality: Left;       Home Medications    Prior to Admission medications   Medication Sig Start Date End Date Taking? Authorizing Provider  acetaminophen (TYLENOL) 500 MG tablet Take 1,000 mg by mouth every 6 (six)  hours as needed for mild pain, moderate pain, fever or headache.    [provider]  amLODipine (NORVASC) 10 MG tablet Take 1 tablet (10 mg total) by mouth daily. Patient not taking: Reported on 11/01/2016 03/06/14   Rodolph Bong, MD  atorvastatin (LIPITOR) 20 MG tablet Take 1 tablet (20 mg total) by mouth daily at 6 PM. Patient not taking: Reported on 11/01/2016 03/06/14   Rodolph Bong, MD  hydrochlorothiazide (MICROZIDE) 12.5 MG capsule Take 1 capsule (12.5 mg total) by mouth daily. 09/11/17   Donnetta Hutching, MD  lisinopril (PRINIVIL,ZESTRIL) 20 MG tablet Take 1 tablet (20 mg total) by mouth daily. 09/11/17   Donnetta Hutching, MD  lisinopril-hydrochlorothiazide (ZESTORETIC) 10-12.5 MG tablet Take 1 tablet by mouth daily. 02/28/17   Donnetta Hutching, MD  nitroGLYCERIN (NITROSTAT) 0.4 MG SL tablet Place 1 tablet (0.4 mg total) under the tongue every 5 (five) minutes as needed for chest pain (CP or SOB). Patient not taking: Reported on 11/01/2016 03/06/14   Rodolph Bong, MD    Family History Family History  Problem Relation Age of Onset  . Hypertension Mother     Social History Social History  Substance Use Topics  . Smoking status: Never Smoker  . Smokeless tobacco: Never Used  . Alcohol use No     Allergies   Patient has no known allergies.   Review of Systems Review of Systems  Constitutional: Positive for chills, fatigue and fever.  HENT: Negative for sore throat.   Respiratory: Positive for cough.   Cardiovascular: Negative for chest pain.  Gastrointestinal: Negative for diarrhea and vomiting.  Neurological: Positive for headaches.  All other systems reviewed and are negative.    Physical Exam Updated Vital Signs BP (!) 184/121   Pulse 100   Temp 99.3 F (37.4 C) (Oral)   Resp 17   Ht 1.829 m (6')   Wt 120.2 kg (265 lb)   SpO2 98%   BMI 35.94 kg/m   Physical Exam CONSTITUTIONAL: Well developed/well nourished HEAD: Normocephalic/atraumatic EYES:  EOMI/PERRL, no nystagmus, no ptosis ENMT: Mucous membranes moist, uvula midline, no erythema noted NECK: supple no meningeal signs SPINE/BACK:entire spine nontender CV: S1/S2 noted, no murmurs/rubs/gallops noted LUNGS: Lungs are clear to auscultation bilaterally, no apparent distress ABDOMEN: soft, nontender, no rebound or guarding GU:no cva tenderness NEURO:Awake/alert, face symmetric, no arm or leg drift is noted EXTREMITIES: pulses normal, full ROM SKIN: warm, color normal Area of folliculitis to right neck, right thigh.  No large abscess noted PSYCH: no abnormalities of mood noted, alert and oriented to situation    ED Treatments / Results  Labs (all labs ordered are listed, but only abnormal results are displayed) Labs Reviewed  COMPREHENSIVE METABOLIC PANEL - Abnormal; Notable for the following:       Result Value   Sodium 132 (*)    Glucose, Bld 156 (*)    Creatinine, Ser 1.32 (*)    Calcium 8.5 (*)    Albumin 3.3 (*)    AST 55 (*)    All other components within normal limits  CBC WITH DIFFERENTIAL/PLATELET - Abnormal; Notable for the following:    Monocytes Absolute 1.1 (*)    All other components within normal limits  URINALYSIS, ROUTINE W REFLEX MICROSCOPIC - Abnormal; Notable for the following:    Hgb urine dipstick SMALL (*)    Protein, ur 100 (*)    All other components within normal limits  CULTURE, BLOOD (ROUTINE X 2)  CULTURE, BLOOD (ROUTINE X 2)  PROTIME-INR  I-STAT CG4 LACTIC ACID, ED    EKG  EKG Interpretation None       Radiology Dg Chest 2 View  Result Date: 09/15/2017 CLINICAL DATA:  Fever and chest pain EXAM: CHEST  2 VIEW COMPARISON:  03/05/2014 FINDINGS: The heart size and mediastinal contours are within normal limits. Both lungs are clear. The visualized skeletal structures are unremarkable. IMPRESSION: No active cardiopulmonary disease. Electronically Signed   By: Tollie Eth M.D.   On: 09/15/2017 03:19     Procedures Procedures  Medications Ordered in ED Medications  acetaminophen (TYLENOL) 325 MG tablet (not administered)  acetaminophen (TYLENOL) tablet 650 mg (650 mg Oral Given 09/15/17 0255)  doxycycline (VIBRA-TABS) tablet 100 mg (100 mg Oral Given 09/15/17 0457)  ibuprofen (ADVIL,MOTRIN) tablet 800 mg (800 mg Oral Given 09/15/17 0610)     Initial Impression / Assessment and Plan / ED Course  I have reviewed the triage vital signs and the nursing notes.  Pertinent labs & imaging results that were available during my care of the patient were reviewed by me and considered in my medical decision making (see chart for details).     Pt stable He is well appearing He has cough/fever/HA Suspect viral illness Reports HA improved Neck supple, no meningeal signs I doubt meningitis  Also - he has areas of folliculitis that will need treated with antibiotics I don't feel this is causing his fever  Also - reports  today's symptoms are not similar to previous ED visits    Pt improved No distress No meningeal signs He denies vomiting blood, he reports after coughing multiple times he had small amt of blood  Otherwise well appearing/nontoxic, not septic appearing D/c home We discussed strict ER return precautions  Final Clinical Impressions(s) / ED Diagnoses   Final diagnoses:  Viral syndrome  Cough  Folliculitis    New Prescriptions Discharge Medication List as of 09/15/2017  6:17 AM    START taking these medications   Details  doxycycline (VIBRAMYCIN) 100 MG capsule Take 1 capsule (100 mg total) by mouth 2 (two) times daily. One po bid x 7 days, Starting Sun 09/15/2017, Print         Zadie RhineWickline, Jahki Witham, MD 09/15/17 [phone]

## 2017-09-15 NOTE — Telephone Encounter (Signed)
Melanie from the lab called with positive blood cultures results. Dr. Eudelia Bunchardama reviewed the chart and he states no further action needed at this time. Dr. Eudelia Bunchardama states to wait for the final blood culture to result.

## 2017-09-17 LAB — CULTURE, BLOOD (ROUTINE X 2): SPECIAL REQUESTS: ADEQUATE

## 2017-09-20 LAB — CULTURE, BLOOD (ROUTINE X 2): Culture: NO GROWTH

## 2019-03-25 IMAGING — CT CT HEAD W/O CM
3 of 4 series · 14 of 47 positions shown, 16 images · non-contrast
Comparison: Head CT December 12, 2005; brain MRI December 12, 2005

CLINICAL DATA: Frontal and temporal headaches for 3 days.
Hypertension.

EXAM:
CT HEAD WITHOUT CONTRAST
TECHNIQUE: Contiguous axial images were obtained from the base of the skull
through the vertex without intravenous contrast.

[Series 2: head w/o · axial · non-contrast · 0.45mm/px · z∈[-134,-14]mm · 8 of 30 slices shown, 10 images]
[im 3/30  brain]
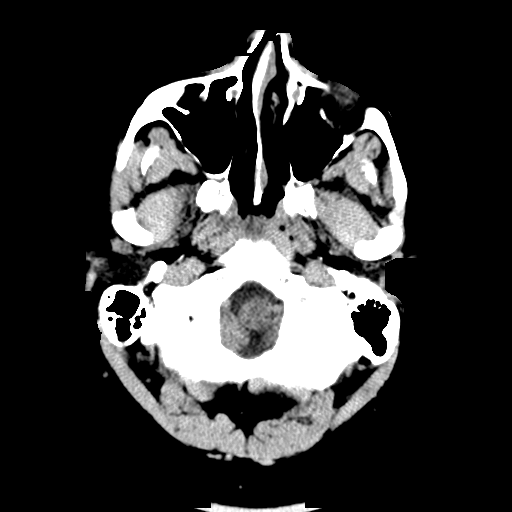
[im 3/30  bone]
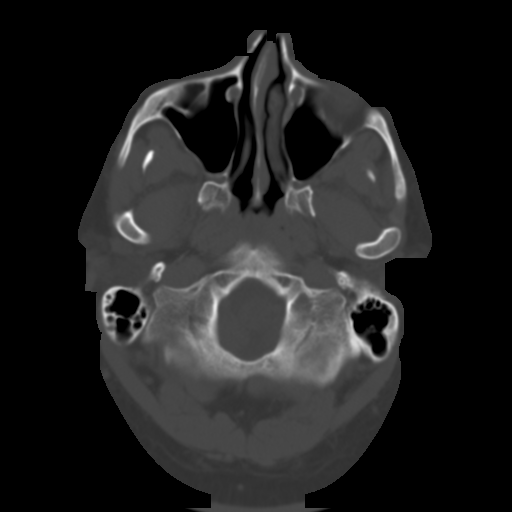
[im 7/30  brain]
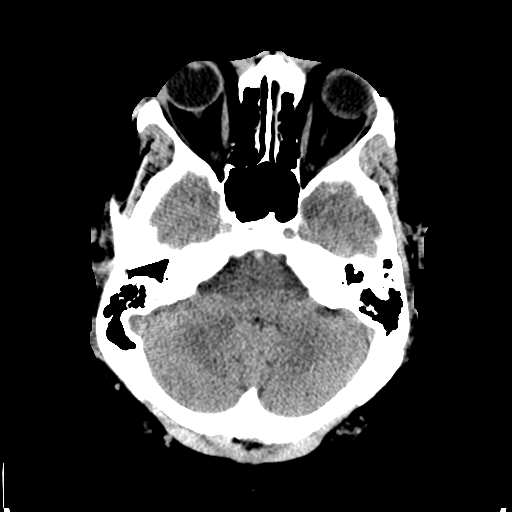
[im 11/30  brain]
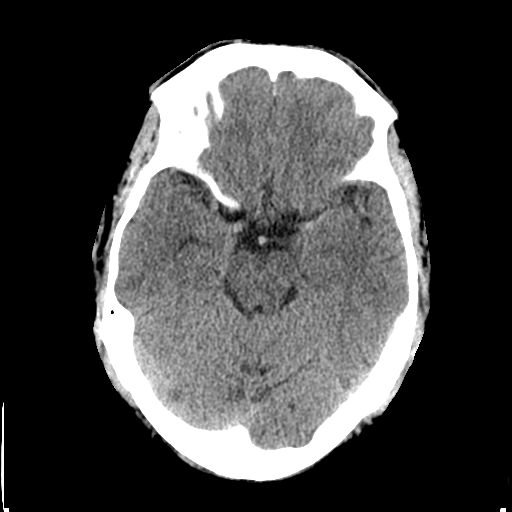
[im 13/30  brain]
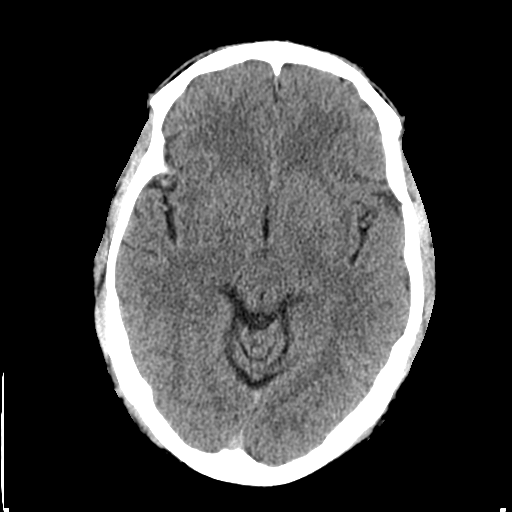
[im 17/30  brain]
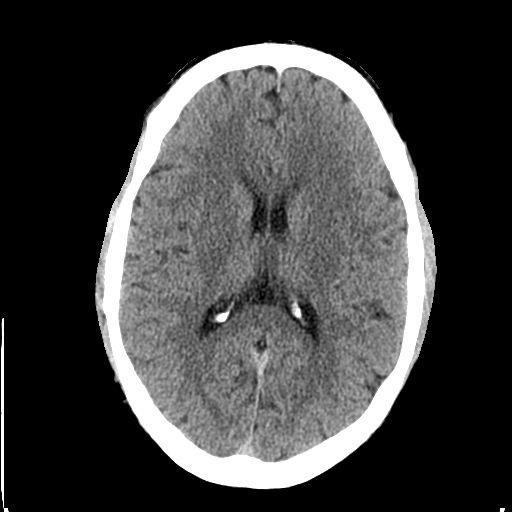
[im 17/30  bone]
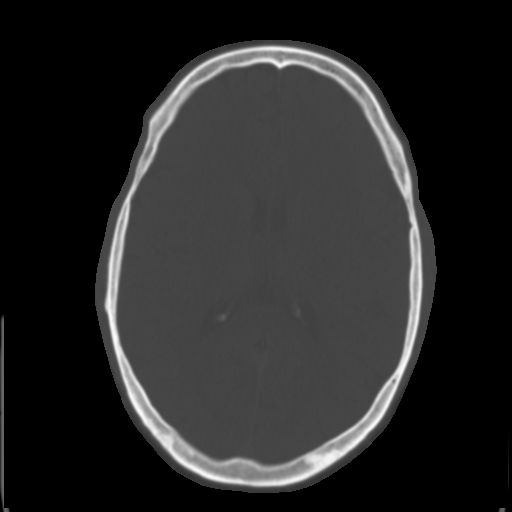
[im 19/30  brain]
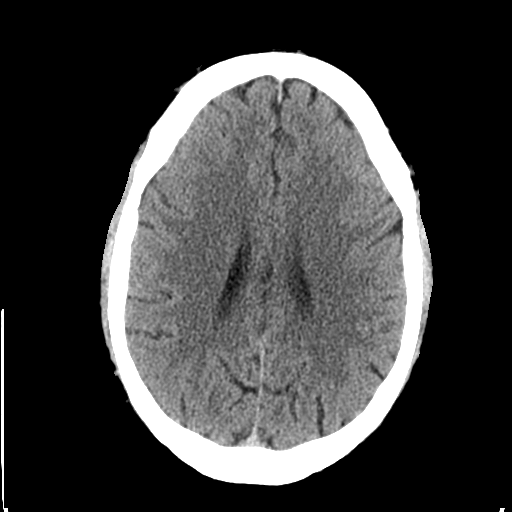
[im 23/30  brain]
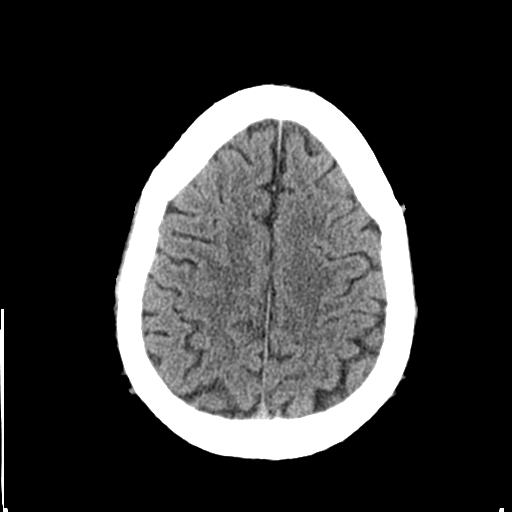
[im 27/30  brain]
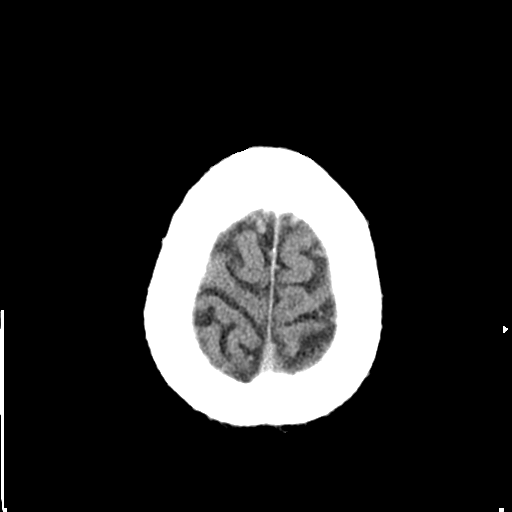

[Series 5: coronal · coronal · 0.30mm/px · 3 of 71 slices shown]
[im 24/71  brain]
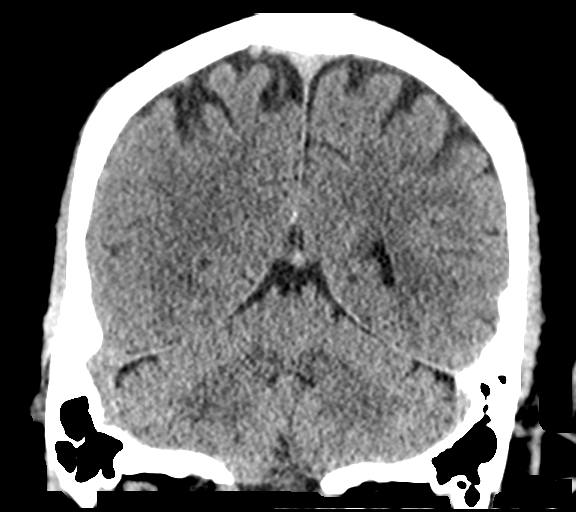
[im 32/71  brain]
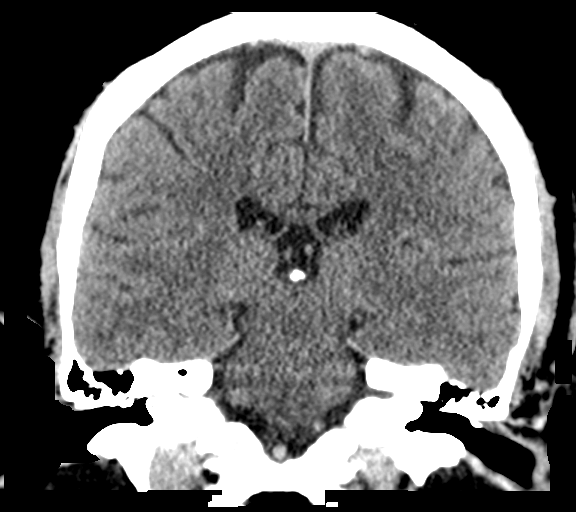
[im 39/71  brain]
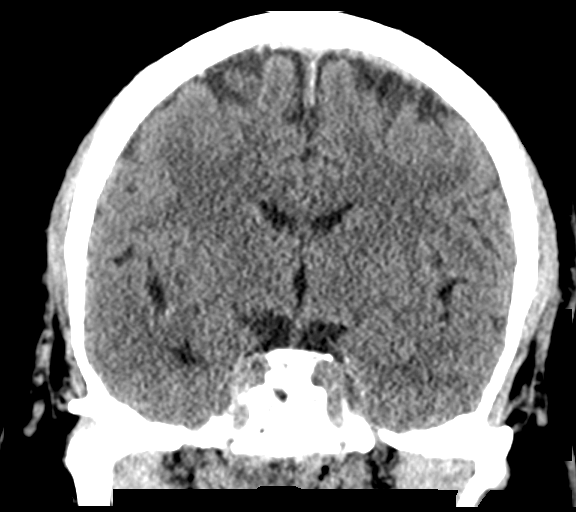

[Series 6: sagittal · sagittal · 0.29mm/px · 3 of 55 slices shown]
[im 19/55  brain]
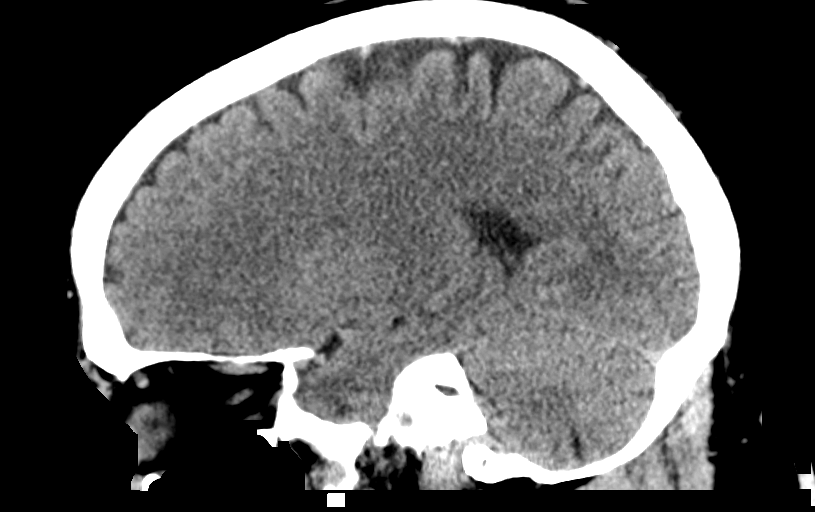
[im 28/55  brain]
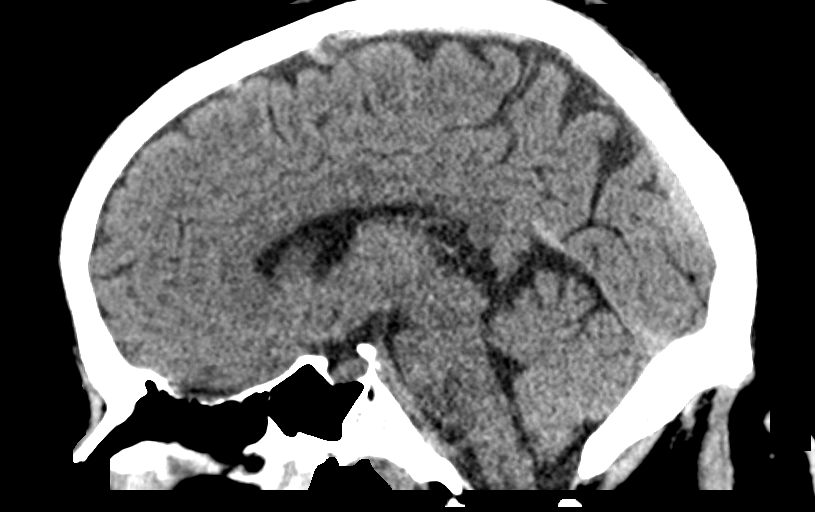
[im 37/55  brain]
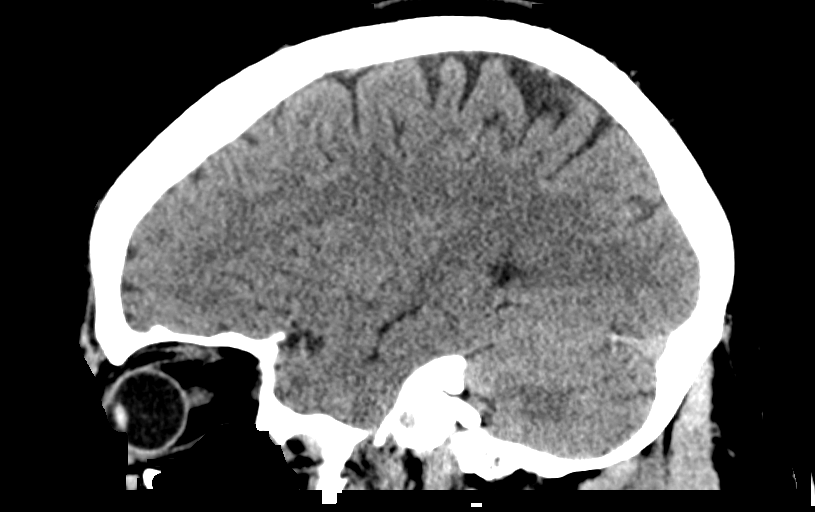

[14 of 47 positions shown; findings below may reference images not displayed]

FINDINGS: Brain: The ventricles are normal in size and configuration. There is
no intracranial mass, hemorrhage, extra-axial fluid collection, or
midline shift. Gray-white compartments appear normal. There is no
evident acute infarct.

Vascular: There is no hyperdense vessel. There is no appreciable
vascular calcification.

Skull: Bony calvarium appears intact.

Sinuses/Orbits: There is mucosal thickening in several ethmoid air
cells bilaterally. There is minimal mucosal thickening in each
maxillary antrum. Other visualized paranasal sinuses are clear.
Frontal sinuses are hypoplastic. There is rightward deviation of the
nasal septum. There is left nasal turbinate edema with partial
obstruction of the left naris. No intraorbital lesions are
appreciable.

Other: Mastoid air cells are clear.
IMPRESSION: Areas of mucosal thickening in several sinus regions. Rightward
deviation nasal septum with narrowing of the left near ease due to
nasal turbinate edema on the left.

No intracranial mass, hemorrhage, or extra-axial fluid collection.
Gray-white compartments appear normal.

## 2019-10-10 IMAGING — CR DG CHEST 2V
2 series · 2 of 2 positions shown · non-contrast
Comparison: 03/05/2014

CLINICAL DATA: Fever and chest pain

EXAM:
CHEST  2 VIEW

[chest pa]
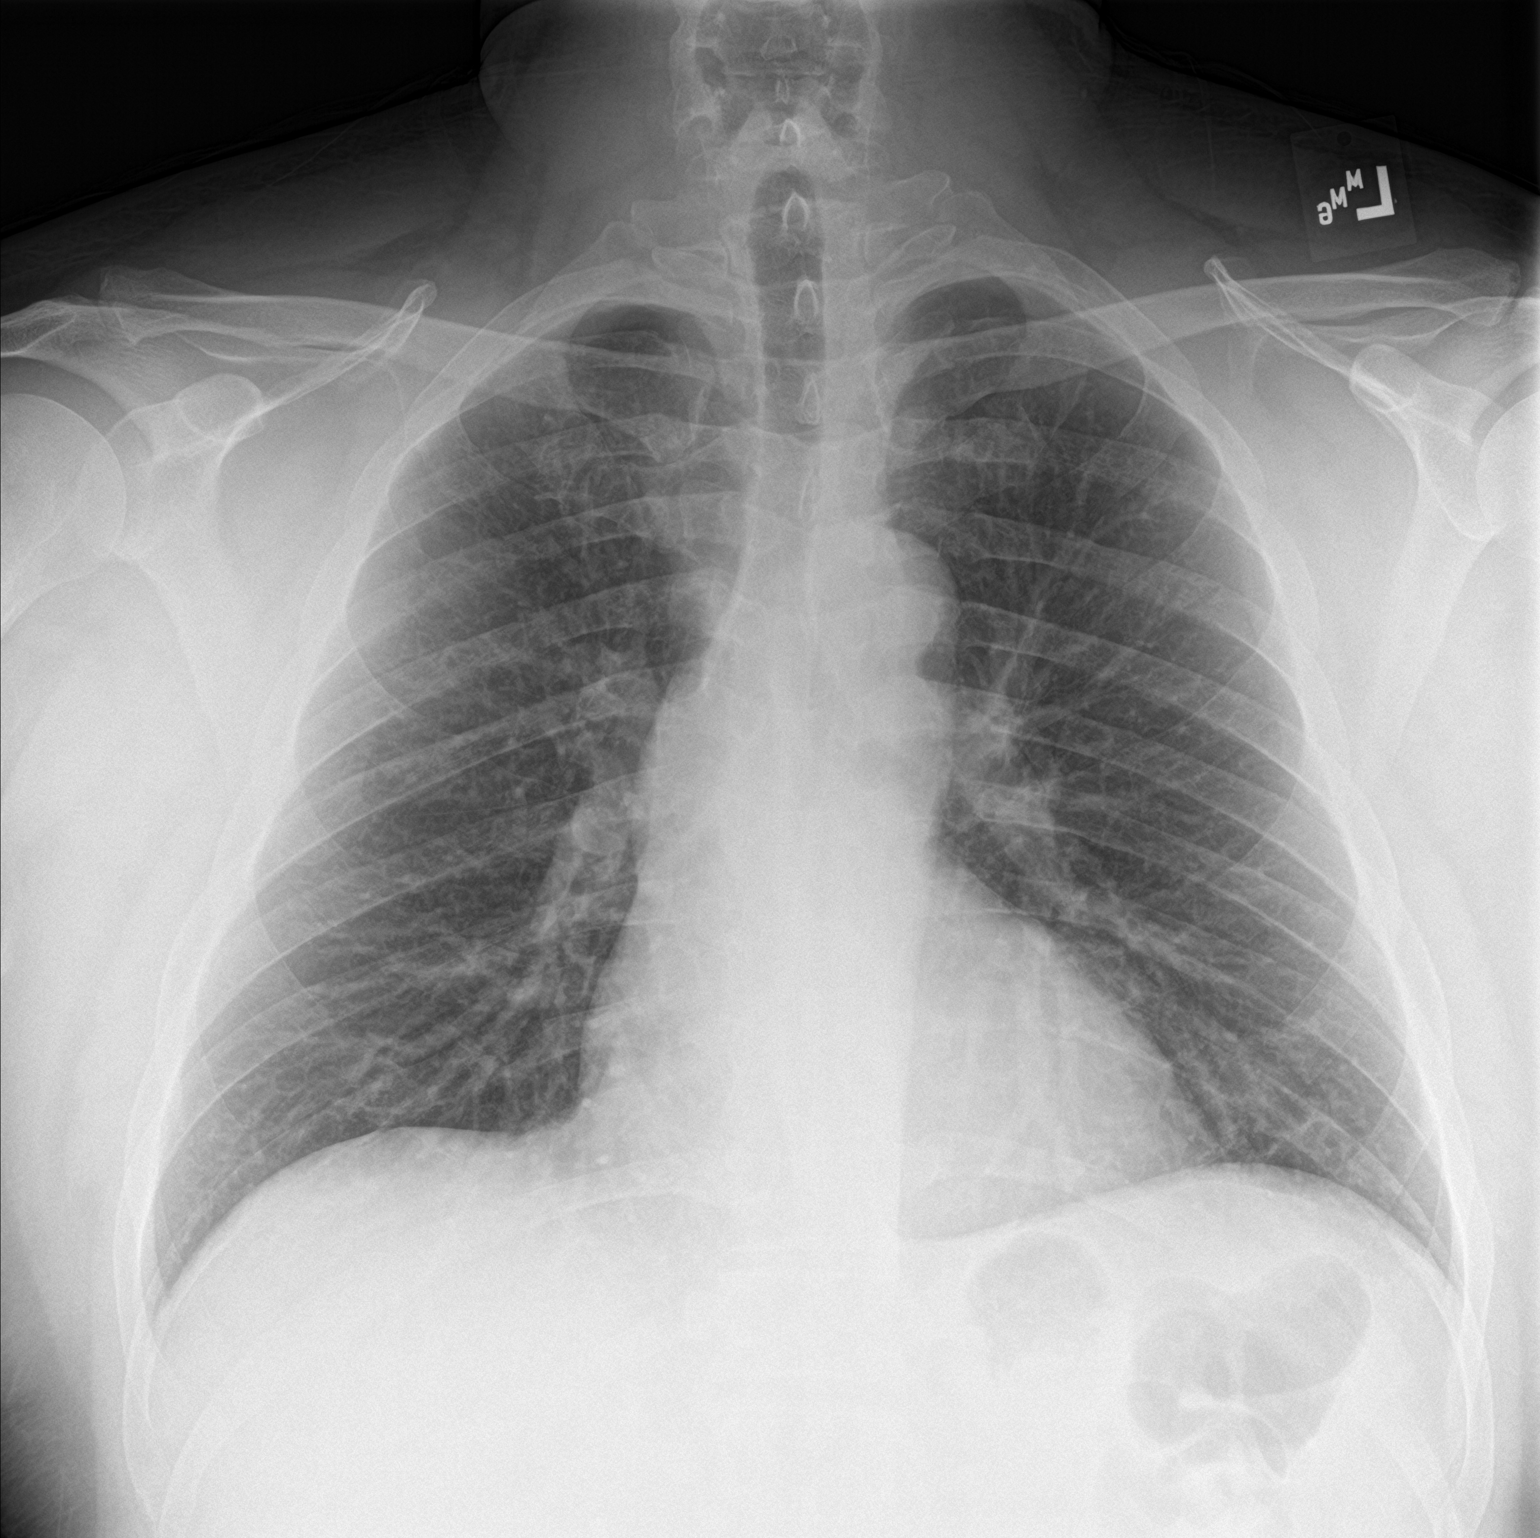

[chest lat]
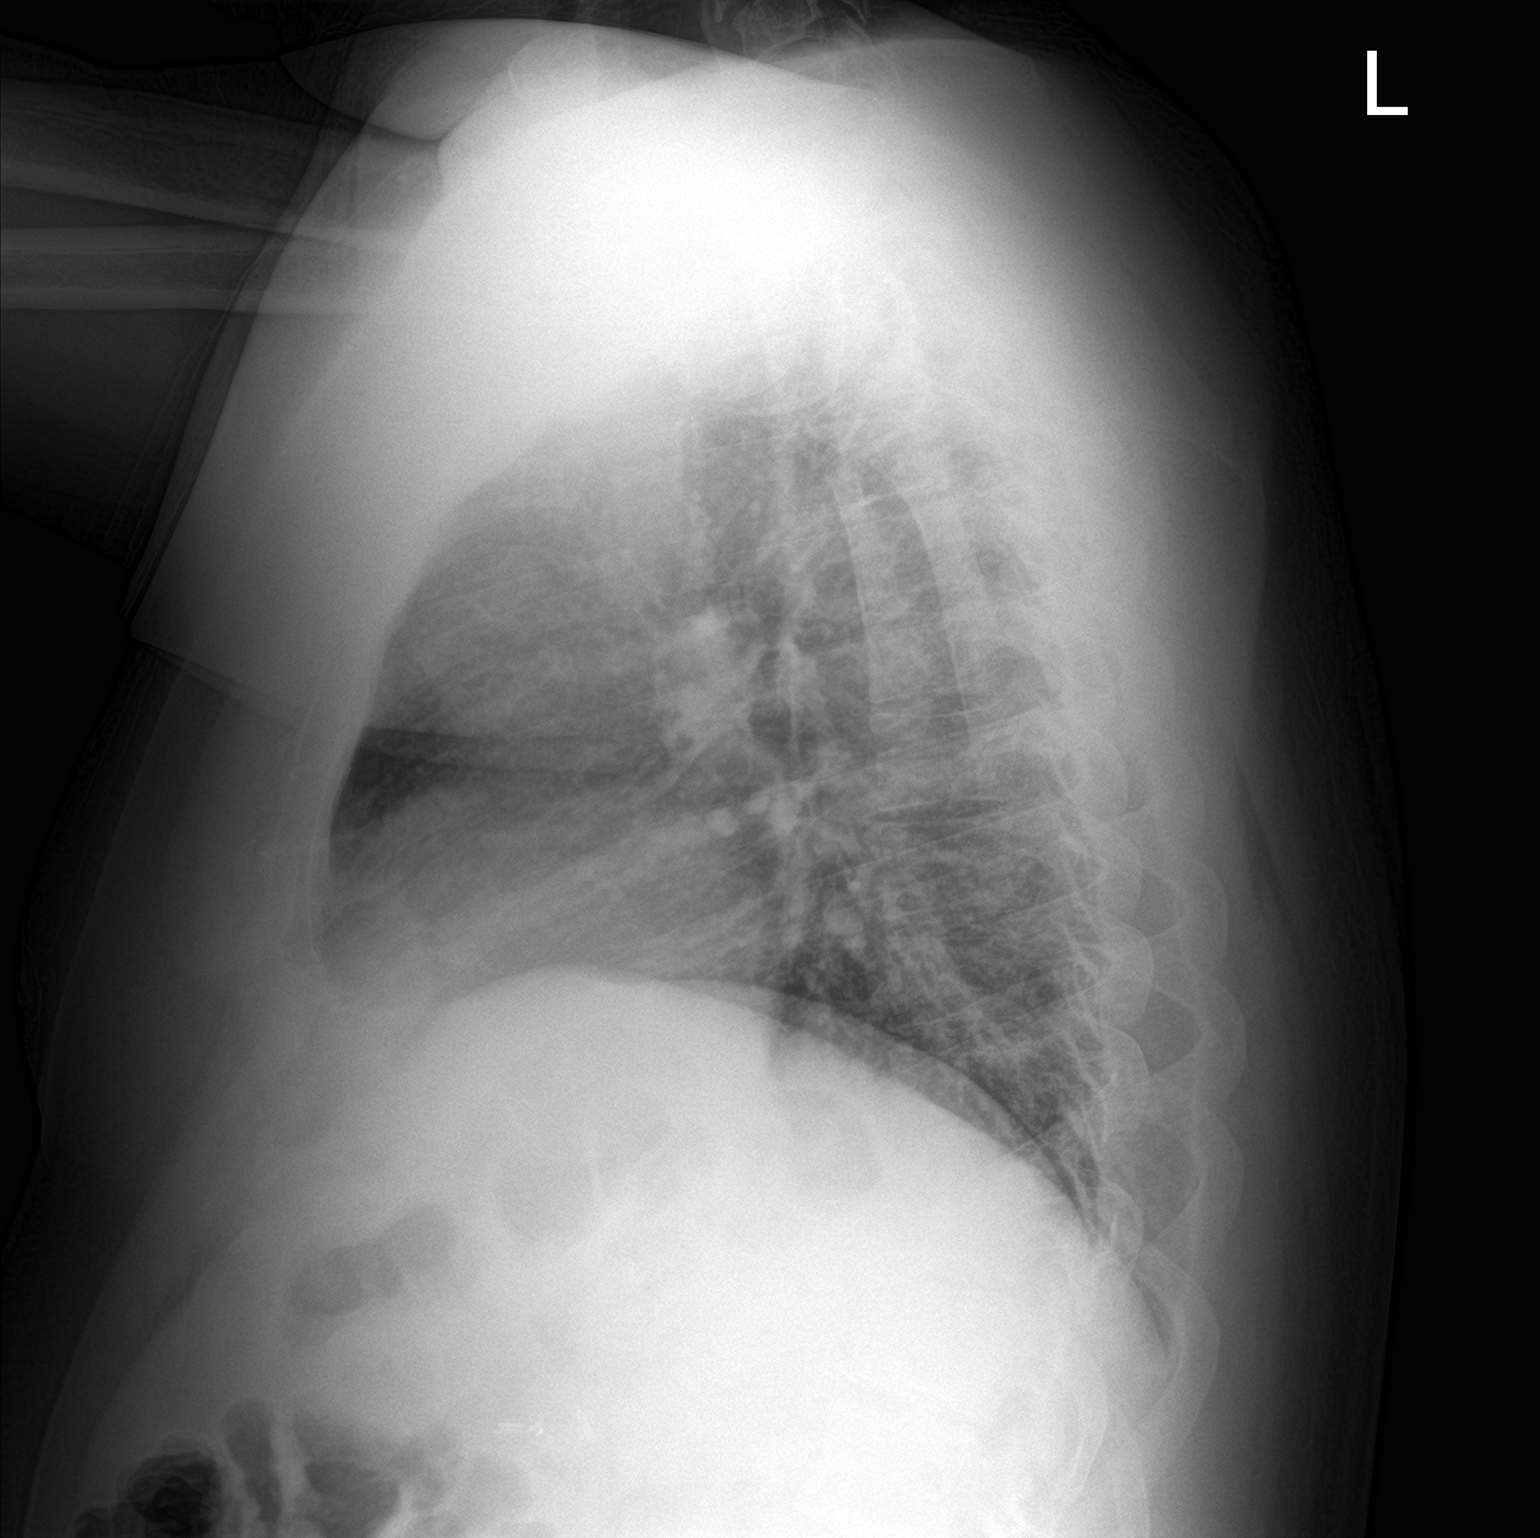

[2 of 2 positions shown; findings below may reference images not displayed]

FINDINGS: The heart size and mediastinal contours are within normal limits.
Both lungs are clear. The visualized skeletal structures are
unremarkable.
IMPRESSION: No active cardiopulmonary disease.

## 2023-02-12 ENCOUNTER — Ambulatory Visit
Admission: EM | Admit: 2023-02-12 | Discharge: 2023-02-12 | Disposition: A | Discharge status: Home / Self Care | Payer: BC Managed Care – PPO

## 2023-02-12 ENCOUNTER — Ambulatory Visit (INDEPENDENT_AMBULATORY_CARE_PROVIDER_SITE_OTHER): Payer: BC Managed Care – PPO

## 2023-02-12 DIAGNOSIS — Z9181 History of falling: Secondary | ICD-10-CM

## 2023-02-12 DIAGNOSIS — M25571 Pain in right ankle and joints of right foot: Secondary | ICD-10-CM

## 2023-02-12 HISTORY — DX: Type 2 diabetes mellitus without complications: E11.9

## 2023-02-12 NOTE — Discharge Instructions (Addendum)
Your x-rays showed no broken bones or dislocations. They were sent to a radiologist for further evaluation and we will call you if the radiologist sees anything different.    Recommend rest, ice, compression, and elevation. You can alternate Tylenol and NSAIDs (Ibuprofen, Alleve) as needed for pain if you are not allergic.  You were given a splint to wear today in office. Recommend you wear it daily for the next week. If no improvement, recommend you follow up with Orthopedics for further evaluation. If new or worsening symptoms such as increased pain, increased swelling, or color changes, recommend you go directly to the ER.

## 2023-02-12 NOTE — ED Triage Notes (Signed)
Pt presents with rt ankle pain after falling from the stairs this morning

## 2023-02-12 NOTE — ED Provider Notes (Signed)
[facility]  Note:  This document was prepared using Dragon voice recognition software and may include unintentional dictation errors.  MRN: [id number] DOB: 22-Feb-1976 DATE: 02/12/23   Subjective:  Chief Complaint:  Chief Complaint  Patient presents with   Ankle Pain     HPI: Brett Hamilton is a 47 y.o. male presenting for R ankle pain. Patient states that approximately 45 minutes ago, he tripped going down the stairs and twisted his ankle. Denies hitting his head, LOC. He states that he slipped on a plastic bag that was on the stairs. Reports pain with walking, weightbearing. 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 Denies fever, numbness/tingling, nausea/vomiting. Patient has not taken anything for the pain at this time. Presents NAD.  Prior to Admission medications   Medication Sig Start Date End Date Taking? Authorizing Provider  amLODipine (NORVASC) 10 MG tablet Take 10 mg by mouth daily.   Yes [provider]  dapagliflozin propanediol (FARXIGA) 10 MG TABS tablet Take by mouth daily.   Yes [provider]  metoprolol tartrate (LOPRESSOR) 50 MG tablet Take 50 mg by mouth 2 (two) times daily.   Yes [provider]  nabumetone (RELAFEN) 500 MG tablet Take 500 mg by mouth daily.   Yes [provider]  valsartan-hydrochlorothiazide (DIOVAN-HCT) 320-25 MG tablet Take 1 tablet by mouth daily.   Yes [provider]     Allergies  Allergen Reactions   Other Other (See Comments)    Blue Cheese- Swelling    History:   Past Medical History:  Diagnosis Date   Arthritis    Diabetes mellitus without complication ([place])    History of kidney stones    Hypertension      Past Surgical History:  Procedure Laterality Date   CHOLECYSTECTOMY     NEPHROLITHOTOMY Left 03/30/2013   Procedure: NEPHROLITHOTOMY PERCUTANEOUS;  Surgeon: Bernestine Amass, MD;  Location: [facility];  Service: Urology;  Laterality: Left;     Family History  Problem Relation Age of Onset   Hypertension Mother     Social History   Tobacco Use   Smoking status: Never   Smokeless tobacco: Never  Substance Use Topics   Alcohol use: No   Drug use: No    Review of Systems  Gastrointestinal:  Negative for nausea and vomiting.  Musculoskeletal:  Positive for arthralgias and joint swelling.     Objective:   Vitals: BP 133/86 (BP Location: Right Arm)   Pulse 85   Temp 98.1 F (36.7 C) (Oral)   Resp 14   SpO2 95%   Physical Exam Constitutional:      General: He is not in acute distress.    Appearance: Normal appearance. He is well-developed. He is obese. He is not ill-appearing or toxic-appearing.  HENT:     Head: Normocephalic and atraumatic.  Cardiovascular:     Rate and Rhythm: Normal rate and regular rhythm.     Heart sounds: Normal heart sounds.  Pulmonary:     Effort: Pulmonary effort is normal.     Breath sounds: Normal breath sounds.     Comments: Clear to auscultation bilaterally  Abdominal:     General: Bowel sounds are normal.     Palpations: Abdomen is soft.     Tenderness: There is no abdominal tenderness.  Musculoskeletal:     Right ankle: Swelling present. Tenderness present over the medial malleolus. Decreased range of motion.     Right foot:  Normal. Normal pulse.     Comments: Decreased ROM in R ankle due to pain with plantarflexion, internal rotation. TTP of R medial mallelous. NV intact. Mild nonpitting edema. No warmth, erythema, or discharge.  Negative Homan's bilaterally.   Skin:    General: Skin is warm and dry.  Neurological:     General: No focal deficit present.     Mental Status: He is alert.  Psychiatric:        Mood and Affect: Mood and affect normal.    Results:  Labs: No results found for this or any previous visit (from the past 24 hour(s)).  Radiology: DG Ankle Complete Right  Result Date: 02/12/2023 CLINICAL DATA:  Right ankle pain after falling from the  stairs this morning. EXAM: RIGHT ANKLE - COMPLETE 3+ VIEW COMPARISON:  None Available. FINDINGS: The ankle mortise is symmetric and intact. Mild posterior tibiotalar joint space narrowing and peripheral degenerative spurring. Mild chronic enthesopathic change at the far medial aspect of the medial malleolus. Small plantar greater than posterior calcaneal heel spurs. Mild-to-moderate dorsal talonavicular and navicular-cuneiform degenerative osteophytes. No acute fracture is seen. No dislocation. IMPRESSION: 1. No acute fracture. 2. Mild tibiotalar, talonavicular and navicular-cuneiform osteoarthritis. Electronically Signed   By: Yvonne Kendall M.D.   On: 02/12/2023 10:37     UC Course/Treatments:  Procedures: Procedures   Medications Ordered in UC: Medications - No data to display   Assessment and Plan :     ICD-10-CM   1. Acute right ankle pain  M25.571     2. History of fall  Z91.81      Acute Right Ankle Pain: Afebrile, nontoxic-appearing, NAD. VSS. DDX includes but not limited to: fracture, sprain, contusion Imaging today showed no fracture. Patient was instructed to wear Aircast Ankle [place] and use crutches. F/U with Orthopedics if no improvement. Recommend RICE regimen and alternating Tylenol/Ibuprofen. Strict ED precautions were given and patient verbalized understanding.   ED Discharge Orders     None        PDMP not reviewed this encounter.     Carmie End, PA-C 02/12/23 1202

## 2023-02-13 ENCOUNTER — Telehealth: Payer: Self-pay

## 2023-03-04 ENCOUNTER — Ambulatory Visit
Admission: EM | Admit: 2023-03-04 | Discharge: 2023-03-04 | Disposition: A | Discharge status: Home / Self Care | Payer: BC Managed Care – PPO | Attending: Emergency Medicine | Admitting: Emergency Medicine

## 2023-03-04 ENCOUNTER — Encounter: Payer: Self-pay | Admitting: Emergency Medicine

## 2023-03-04 DIAGNOSIS — J3089 Other allergic rhinitis: Secondary | ICD-10-CM

## 2023-03-04 DIAGNOSIS — B9689 Other specified bacterial agents as the cause of diseases classified elsewhere: Secondary | ICD-10-CM

## 2023-03-04 DIAGNOSIS — J019 Acute sinusitis, unspecified: Secondary | ICD-10-CM

## 2023-03-04 MED ORDER — FEXOFENADINE HCL 180 MG PO TABS: 180.0000 mg | ORAL_TABLET | Freq: Every day | ORAL | 1 refills | Status: AC

## 2023-03-04 MED ORDER — FLUTICASONE PROPIONATE 50 MCG/ACT NA SUSP: 1.0000 | Freq: Every day | NASAL | 2 refills | Status: DC

## 2023-03-04 MED ORDER — IBUPROFEN 400 MG PO TABS: 400.0000 mg | ORAL_TABLET | Freq: Three times a day (TID) | ORAL | 0 refills | Status: AC | PRN

## 2023-03-04 MED ORDER — OLOPATADINE HCL 0.2 % OP SOLN: 1.0000 [drp] | Freq: Every day | OPHTHALMIC | 1 refills | Status: DC

## 2023-03-04 MED ORDER — CEFDINIR 300 MG PO CAPS: 300.0000 mg | ORAL_CAPSULE | Freq: Two times a day (BID) | ORAL | 0 refills | Status: AC

## 2023-03-04 NOTE — Discharge Instructions (Signed)
Please read below to learn more about the medications, dosages and frequencies that I recommend to help alleviate your symptoms and to get you feeling better soon:   Omnicef (cefdinir):  Please take one (1) dose twice daily for 10 days.  This antibiotic can cause upset stomach, this will resolve once antibiotics are complete.  You are welcome to take a probiotic, eat yogurt, take Imodium while taking this medication.  Please avoid other systemic medications such as Maalox, Pepto-Bismol or milk of magnesia as they can interfere with the body's ability to absorb the antibiotics.   Your symptoms and my physical exam findings are concerning for exacerbation of your underlying allergies.     To avoid catching frequent respiratory infections, having skin reactions, dealing with eye irritation, losing sleep, missing work, etc., due to uncontrolled allergies, it is important that you begin/continue your allergy regimen and are consistent with taking your meds exactly as prescribed.   Allegra (fexofenadine): This is an excellent second-generation antihistamine that helps to reduce respiratory inflammatory response to environmental allergens.  This medication is not known to cause daytime sleepiness so it can be taken in the daytime.  If you find that it does make you sleepy, please feel free to take it at bedtime.   Flonase (fluticasone): This is a steroid nasal spray that used once daily, 1 spray in each nare.  This works best when used on a daily basis. This medication does not work well if it is only used when you think you need it.  After 3 to 5 days of use, you will notice significant reduction of the inflammation and mucus production that is currently being caused by exposure to allergens, whether seasonal or environmental.  The most common side effect of this medication is nosebleeds.  If you experience a nosebleed, please discontinue use for 1 week, then feel free to resume.  If you find that your insurance  will not pay for this medication, please consider a different nasal steroids such as Nasonex (mometasone), or Nasacort (triamcinolone).   Pataday (olopatadine): This is an antihistamine eyedrop that can be used once daily to help relieve dry eyes, itchy eyes and red eyes.  Please do not use this drop more than once a day, for best relief please use this in the morning.     Advil, Motrin (ibuprofen): This is a good anti-inflammatory medication which not only addresses aches, pains but also significantly reduces soft tissue inflammation of the upper airways that causes sinus and nasal congestion as well as inflammation of the lower airways which makes you feel like your breathing is constricted or your cough feel tight.  I recommend that you take 400 mg every 8 hours as needed.      If symptoms have not meaningfully improved in the next 5 to 7 days, please return for repeat evaluation or follow-up with your regular provider.  If symptoms have worsened in the next 3 to 5 days, please return for repeat evaluation or follow-up with your regular provider.    Thank you for visiting urgent care today.  We appreciate the opportunity to participate in your care.

## 2023-03-04 NOTE — ED Triage Notes (Signed)
Pt reports a headache, sinus congestion and nasal congestion since Friday. Has tried taking allergy medication this morning with no relief.

## 2023-03-04 NOTE — ED Provider Notes (Signed)
[facility]    CSN: [id number] Arrival date & time: 03/04/23  1129    HISTORY   Chief Complaint  Patient presents with   Headache   Sinus Congestion   Nasal Congestion   HPI Brett Hamilton is a pleasant, 47 y.o. male who presents to urgent care today. Pt c/o a frontal and maxillary headache, sinus pressure, burning, redness and aching in both eyes, nasal congestion and nasal congestion since Friday.  Patient states has been using a saline nose spray (not medicated) and Allegra-C, first dose was taken today, states neither has been helpful.  Patient states his sinus pressure and pain is worse when he leans forward and describes the pain as a constant, dull ache.  The history is provided by the patient.   Past Medical History:  Diagnosis Date   Arthritis    Diabetes mellitus without complication    History of kidney stones    Hypertension    Patient Active Problem List   Diagnosis Date Noted   Chest pain 03/05/2014   HTN (hypertension) 03/05/2014   Past Surgical History:  Procedure Laterality Date   CHOLECYSTECTOMY     NEPHROLITHOTOMY Left 03/30/2013   Procedure: NEPHROLITHOTOMY PERCUTANEOUS;  Surgeon: Valetta Fuller, MD;  Location: [facility];  Service: Urology;  Laterality: Left;    Home Medications    Prior to Admission medications   Medication Sig Start Date End Date Taking? Authorizing Provider  amLODipine (NORVASC) 10 MG tablet Take 10 mg by mouth daily.    [provider]  dapagliflozin propanediol (FARXIGA) 10 MG TABS tablet Take by mouth daily.    [provider]  metoprolol tartrate (LOPRESSOR) 50 MG tablet Take 50 mg by mouth 2 (two) times daily.    [provider]  nabumetone (RELAFEN) 500 MG tablet Take 500 mg by mouth daily.    [provider]  valsartan-hydrochlorothiazide (DIOVAN-HCT) 320-25 MG tablet Take 1 tablet by mouth daily.    [provider]    Family History Family History  Problem Relation Age  of Onset   Hypertension Mother    Social History Social History   Tobacco Use   Smoking status: Never   Smokeless tobacco: Never  Substance Use Topics   Alcohol use: No   Drug use: No   Allergies   Metformin and Other  Review of Systems Review of Systems Pertinent findings revealed after performing a 14 point review of systems has been noted in the history of present illness.  Physical Exam Vital Signs BP (!) 138/91 (BP Location: Right Arm)   Pulse 88   Temp 97.9 F (36.6 C) (Oral)   Resp 15   SpO2 94%   No data found.  Physical Exam Vitals and nursing note reviewed.  Constitutional:      General: He is not in acute distress.    Appearance: Normal appearance. He is not ill-appearing.  HENT:     Head: Normocephalic and atraumatic.     Salivary Glands: Right salivary gland is not diffusely enlarged or tender. Left salivary gland is not diffusely enlarged or tender.     Right Ear: Ear canal and external ear normal. No decreased hearing noted. No drainage or tenderness. A middle ear effusion is present. There is no impacted cerumen. Tympanic membrane is bulging. Tympanic membrane is not injected, erythematous or retracted.     Left Ear: Ear canal and external ear normal. No decreased hearing noted. No drainage or tenderness. A middle ear effusion  is present. There is no impacted cerumen. Tympanic membrane is bulging. Tympanic membrane is not injected, erythematous or retracted.     Ears:     Comments: Bilateral EACs normal, both TMs bulging with suppurative fluid    Nose: Rhinorrhea present. No nasal deformity, septal deviation, signs of injury, nasal tenderness, mucosal edema or congestion. Rhinorrhea is clear.     Right Nostril: Occlusion present. No foreign body, epistaxis or septal hematoma.     Left Nostril: Occlusion present. No foreign body, epistaxis or septal hematoma.     Right Turbinates: Enlarged, swollen and pale.     Left Turbinates: Enlarged, swollen and  pale.     Right Sinus: No maxillary sinus tenderness or frontal sinus tenderness.     Left Sinus: No maxillary sinus tenderness or frontal sinus tenderness.     Mouth/Throat:     Lips: Pink. No lesions.     Mouth: Mucous membranes are moist. No oral lesions.     Pharynx: Oropharynx is clear. Uvula midline. No posterior oropharyngeal erythema or uvula swelling.     Tonsils: No tonsillar exudate. 0 on the right. 0 on the left.     Comments: Postnasal drip Eyes:     General: Lids are normal.        Right eye: No discharge.        Left eye: No discharge.     Extraocular Movements: Extraocular movements intact.     Conjunctiva/sclera: Conjunctivae normal.     Right eye: Right conjunctiva is not injected.     Left eye: Left conjunctiva is not injected.  Neck:     Trachea: Trachea and phonation normal.  Cardiovascular:     Rate and Rhythm: Regular rhythm.     Pulses: Normal pulses.     Heart sounds: Normal heart sounds. No murmur heard.    No friction rub. No gallop.  Pulmonary:     Effort: Pulmonary effort is normal. No accessory muscle usage, prolonged expiration or respiratory distress.     Breath sounds: Normal breath sounds. No stridor, decreased air movement or transmitted upper airway sounds. No decreased breath sounds, wheezing, rhonchi or rales.  Chest:     Chest wall: No tenderness.  Musculoskeletal:        General: Normal range of motion.     Cervical back: Normal range of motion and neck supple. Normal range of motion.  Lymphadenopathy:     Cervical: No cervical adenopathy.  Skin:    General: Skin is warm and dry.     Findings: No erythema or rash.  Neurological:     General: No focal deficit present.     Mental Status: He is alert and oriented to person, place, and time.  Psychiatric:        Mood and Affect: Mood normal.        Behavior: Behavior normal.     Visual Acuity Right Eye Distance:   Left Eye Distance:   Bilateral Distance:    Right Eye Near:   Left  Eye Near:    Bilateral Near:     [facility] Couse / Diagnostics / Procedures:     Radiology No results found.  Procedures Procedures (including critical care time) EKG  Pending results:  Labs Reviewed - No data to display  Medications Ordered in UC: Medications - No data to display  UC Diagnoses / Final Clinical Impressions(s)   I have reviewed the triage vital signs and the nursing notes.  Pertinent labs & imaging  results that were available during my care of the patient were reviewed by me and considered in my medical decision making (see chart for details).    Final diagnoses:  Acute bacterial sinusitis  Seasonal allergic rhinitis due to other allergic trigger   Patient was provided with a 10-day course of cefdinir for empiric treatment of presumed sinus infection based on suppurative fluid seen behind eardrums without other evidence of acute otitis media.  Patient provided with allergy medications for physical exam findings concerning for allergies.  Instructions provided to patient.  Conservative care recommended.  Return precautions advised.  Please see discharge instructions below for further details of plan of care as provided to patient. ED Prescriptions     Medication Sig Dispense Auth. Provider   cefdinir (OMNICEF) 300 MG capsule Take 1 capsule (300 mg total) by mouth 2 (two) times daily for 10 days. 20 capsule Theadora Rama Scales, PA-C   fexofenadine (ALLEGRA) 180 MG tablet Take 1 tablet (180 mg total) by mouth daily. 90 tablet Theadora Rama Scales, PA-C   fluticasone (FLONASE) 50 MCG/ACT nasal spray Place 1 spray into both nostrils daily. Begin by using 2 sprays in each nare daily for 3 to 5 days, then decrease to 1 spray in each nare daily. 15.8 mL Theadora Rama Scales, PA-C   Olopatadine HCl (PATADAY) 0.2 % SOLN Apply 1 drop to eye daily. 2.5 mL Theadora Rama Scales, PA-C   ibuprofen (ADVIL) 400 MG tablet Take 1 tablet (400 mg total) by mouth every 8 (eight)  hours as needed for up to 30 doses. 30 tablet Theadora Rama Scales, PA-C      PDMP not reviewed this encounter.  Disposition Upon Discharge:  Condition: stable for discharge home Home: take medications as prescribed; routine discharge instructions as discussed; follow up as advised.  Patient presented with an acute illness with associated systemic symptoms and significant discomfort requiring urgent management. In my opinion, this is a condition that a prudent lay person (someone who possesses an average knowledge of health and medicine) may potentially expect to result in complications if not addressed urgently such as respiratory distress, impairment of bodily function or dysfunction of bodily organs.   Routine symptom specific, illness specific and/or disease specific instructions were discussed with the patient and/or caregiver at length.   As such, the patient has been evaluated and assessed, work-up was performed and treatment was provided in alignment with urgent care protocols and evidence based medicine.  Patient/parent/caregiver has been advised that the patient may require follow up for further testing and treatment if the symptoms continue in spite of treatment, as clinically indicated and appropriate.  If the patient was tested for COVID-19, Influenza and/or RSV, then the patient/parent/guardian was advised to isolate at home pending the results of his/her diagnostic coronavirus test and potentially longer if they're positive. I have also advised pt that if his/her COVID-19 test returns positive, it's recommended to self-isolate for at least 10 days after symptoms first appeared AND until fever-free for 24 hours without fever reducer AND other symptoms have improved or resolved. Discussed self-isolation recommendations as well as instructions for household member/close contacts as per the [facility] and [place] DHHS, and also gave patient the COVID packet with this  information.  Patient/parent/caregiver has been advised to return to the [facility] or PCP in 3-5 days if no better; to PCP or the Emergency Department if new signs and symptoms develop, or if the current signs or symptoms continue to change or worsen for further workup,  evaluation and treatment as clinically indicated and appropriate  The patient will follow up with their current PCP if and as advised. If the patient does not currently have a PCP we will assist them in obtaining one.   The patient may need specialty follow up if the symptoms continue, in spite of conservative treatment and management, for further workup, evaluation, consultation and treatment as clinically indicated and appropriate.  Patient/parent/caregiver verbalized understanding and agreement of plan as discussed.  All questions were addressed during visit.  Please see discharge instructions below for further details of plan.  Discharge Instructions:   Discharge Instructions      Please read below to learn more about the medications, dosages and frequencies that I recommend to help alleviate your symptoms and to get you feeling better soon:   Omnicef (cefdinir):  Please take one (1) dose twice daily for 10 days.  This antibiotic can cause upset stomach, this will resolve once antibiotics are complete.  You are welcome to take a probiotic, eat yogurt, take Imodium while taking this medication.  Please avoid other systemic medications such as Maalox, Pepto-Bismol or milk of magnesia as they can interfere with the body's ability to absorb the antibiotics.   Your symptoms and my physical exam findings are concerning for exacerbation of your underlying allergies.     To avoid catching frequent respiratory infections, having skin reactions, dealing with eye irritation, losing sleep, missing work, etc., due to uncontrolled allergies, it is important that you begin/continue your allergy regimen and are consistent with taking your meds  exactly as prescribed.   Allegra (fexofenadine): This is an excellent second-generation antihistamine that helps to reduce respiratory inflammatory response to environmental allergens.  This medication is not known to cause daytime sleepiness so it can be taken in the daytime.  If you find that it does make you sleepy, please feel free to take it at bedtime.   Flonase (fluticasone): This is a steroid nasal spray that used once daily, 1 spray in each nare.  This works best when used on a daily basis. This medication does not work well if it is only used when you think you need it.  After 3 to 5 days of use, you will notice significant reduction of the inflammation and mucus production that is currently being caused by exposure to allergens, whether seasonal or environmental.  The most common side effect of this medication is nosebleeds.  If you experience a nosebleed, please discontinue use for 1 week, then feel free to resume.  If you find that your insurance will not pay for this medication, please consider a different nasal steroids such as Nasonex (mometasone), or Nasacort (triamcinolone).   Pataday (olopatadine): This is an antihistamine eyedrop that can be used once daily to help relieve dry eyes, itchy eyes and red eyes.  Please do not use this drop more than once a day, for best relief please use this in the morning.     Advil, Motrin (ibuprofen): This is a good anti-inflammatory medication which not only addresses aches, pains but also significantly reduces soft tissue inflammation of the upper airways that causes sinus and nasal congestion as well as inflammation of the lower airways which makes you feel like your breathing is constricted or your cough feel tight.  I recommend that you take 400 mg every 8 hours as needed.      If symptoms have not meaningfully improved in the next 5 to 7 days, please return for repeat evaluation or  follow-up with your regular provider.  If symptoms have worsened  in the next 3 to 5 days, please return for repeat evaluation or follow-up with your regular provider.    Thank you for visiting urgent care today.  We appreciate the opportunity to participate in your care.       This office note has been dictated using Teaching laboratory technician.  Unfortunately, this method of dictation can sometimes lead to typographical or grammatical errors.  I apologize for your inconvenience in advance if this occurs.  Please do not hesitate to reach out to me if clarification is needed.      Theadora Rama Scales, PA-C 03/04/23 1158

## 2024-07-28 ENCOUNTER — Other Ambulatory Visit: Payer: Self-pay

## 2024-07-28 ENCOUNTER — Ambulatory Visit
Admission: EM | Admit: 2024-07-28 | Discharge: 2024-07-28 | Disposition: A | Discharge status: Home / Self Care | Attending: Internal Medicine | Admitting: Internal Medicine

## 2024-07-28 DIAGNOSIS — U071 COVID-19: Secondary | ICD-10-CM

## 2024-07-28 DIAGNOSIS — R509 Fever, unspecified: Secondary | ICD-10-CM

## 2024-07-28 DIAGNOSIS — R0981 Nasal congestion: Secondary | ICD-10-CM

## 2024-07-28 DIAGNOSIS — H66001 Acute suppurative otitis media without spontaneous rupture of ear drum, right ear: Secondary | ICD-10-CM | POA: Diagnosis not present

## 2024-07-28 LAB — POC COVID19/FLU A&B COMBO
Covid Antigen, POC: POSITIVE — AB
Influenza A Antigen, POC: NEGATIVE
Influenza B Antigen, POC: NEGATIVE

## 2024-07-28 MED ORDER — AMOXICILLIN 875 MG PO TABS: 875.0000 mg | ORAL_TABLET | Freq: Two times a day (BID) | ORAL | 0 refills | Status: AC

## 2024-07-28 MED ORDER — FLUTICASONE PROPIONATE 50 MCG/ACT NA SUSP: 1.0000 | Freq: Every day | NASAL | 0 refills | Status: DC | PRN

## 2024-07-28 MED ORDER — LAGEVRIO 200 MG PO CAPS: 4.0000 | ORAL_CAPSULE | Freq: Two times a day (BID) | ORAL | 0 refills | Status: AC

## 2024-07-28 MED ORDER — BENZONATATE 200 MG PO CAPS: 200.0000 mg | ORAL_CAPSULE | Freq: Three times a day (TID) | ORAL | 0 refills | Status: DC | PRN

## 2024-07-28 NOTE — ED Provider Notes (Signed)
 [facility]  Note:  This document was prepared using Dragon voice recognition software and may include unintentional dictation errors.  MRN: [id number] DOB: 07-22-1976 DATE: 07/28/24   Subjective:  Chief Complaint:  Chief Complaint  Patient presents with   Fever     HPI: Brett Hamilton is a 48 y.o. male presenting for fever and nasal congestion for one day. Patient states yesterday evening he started feeling unwell with nasal congestion. He reports waking this morning with a fever of 101. He also reports headache and myalgias. He reports taking tylenol  with no relief. Daughter at home has been sick as well. Denies nausea/vomiting, abdominal pain, sore throat, otalgia, cough. Endorses fever, nasal congestion, myalgias, headache. Presents NAD.  Prior to Admission medications   Medication Sig Start Date End Date Taking? Authorizing Provider  amLODipine  (NORVASC ) 10 MG tablet Take 10 mg by mouth daily.    [provider]  dapagliflozin propanediol (FARXIGA) 10 MG TABS tablet Take by mouth daily.    [provider]  fexofenadine  (ALLEGRA ) 180 MG tablet Take 1 tablet (180 mg total) by mouth daily. 03/04/23 08/31/23  Joesph Shaver Scales, PA-C  fluticasone  (FLONASE ) 50 MCG/ACT nasal spray Place 1 spray into both nostrils daily. Begin by using 2 sprays in each nare daily for 3 to 5 days, then decrease to 1 spray in each nare daily. 03/04/23   Joesph Shaver Scales, PA-C  ibuprofen  (ADVIL ) 400 MG tablet Take 1 tablet (400 mg total) by mouth every 8 (eight) hours as needed for up to 30 doses. 03/04/23   Joesph Shaver Scales, PA-C  metoprolol tartrate (LOPRESSOR) 50 MG tablet Take 50 mg by mouth 2 (two) times daily.    [provider]  nabumetone (RELAFEN) 500 MG tablet Take 500 mg by mouth daily.    [provider]  Olopatadine  HCl (PATADAY ) 0.2 % SOLN Apply 1 drop to eye daily. 03/04/23   Joesph Shaver Scales, PA-C  valsartan-hydrochlorothiazide   (DIOVAN-HCT) 320-25 MG tablet Take 1 tablet by mouth daily.    [provider]     Allergies  Allergen Reactions   Metformin Other (See Comments)   Other Other (See Comments)    Blue Cheese- Swelling    History:   Past Medical History:  Diagnosis Date   Arthritis    Diabetes mellitus without complication (HCC)    History of kidney stones    Hypertension      Past Surgical History:  Procedure Laterality Date   CHOLECYSTECTOMY     NEPHROLITHOTOMY Left 03/30/2013   Procedure: NEPHROLITHOTOMY PERCUTANEOUS;  Surgeon: Alm GORMAN Fragmin, MD;  Location: [facility];  Service: Urology;  Laterality: Left;    Family History  Problem Relation Age of Onset   Hypertension Mother     Social History   Tobacco Use   Smoking status: Never   Smokeless tobacco: Never  Substance Use Topics   Alcohol use: No   Drug use: No    Review of Systems  Constitutional:  Positive for fatigue and fever.  HENT:  Positive for congestion, postnasal drip, rhinorrhea and sinus pressure. Negative for ear discharge, ear pain and sore throat.   Respiratory:  Negative for cough.   Gastrointestinal:  Negative for abdominal pain, nausea and vomiting.  Musculoskeletal:  Positive for myalgias.  Neurological:  Positive for headaches.     Objective:   Vitals: BP 135/89 (BP Location: Right Arm)   Pulse 91   Temp 98.3 F (36.8 C) (Oral)   Resp 18  SpO2 94%   Physical Exam Constitutional:      General: He is not in acute distress.    Appearance: Normal appearance. He is well-developed and overweight. He is not ill-appearing or toxic-appearing.  HENT:     Head: Normocephalic and atraumatic.     Right Ear: A middle ear effusion is present. Tympanic membrane is erythematous and bulging.     Left Ear: Ear canal normal. A middle ear effusion is present.     Ears:     Comments: Right TM bulging and slightly erythematous. Opacified fluid noted behind the right TM.     Nose: Rhinorrhea present.  Rhinorrhea is clear.     Right Turbinates: Swollen.     Left Turbinates: Swollen.     Mouth/Throat:     Pharynx: Uvula midline. Posterior oropharyngeal erythema present. No pharyngeal swelling or oropharyngeal exudate.     Tonsils: No tonsillar exudate or tonsillar abscesses.  Cardiovascular:     Rate and Rhythm: Normal rate and regular rhythm.     Heart sounds: Normal heart sounds.  Pulmonary:     Effort: Pulmonary effort is normal.     Breath sounds: Normal breath sounds.     Comments: Clear to auscultation bilaterally  Abdominal:     General: Bowel sounds are normal.     Palpations: Abdomen is soft.     Tenderness: There is no abdominal tenderness.  Skin:    General: Skin is warm and dry.  Neurological:     General: No focal deficit present.     Mental Status: He is alert.  Psychiatric:        Mood and Affect: Mood and affect normal.     Results:  Labs: No results found for this or any previous visit (from the past 24 hours).  Radiology: No results found.   UC Course/Treatments:  Procedures: Procedures   Medications Ordered in UC: Medications - No data to display   Assessment and Plan :     ICD-10-CM   1. COVID-19  U07.1     2. Fever, unspecified  R50.9 POC Covid19/Flu A&B Antigen    POC Covid19/Flu A&B Antigen    3. Nasal congestion  R09.81     4. Non-recurrent acute suppurative otitis media of right ear without spontaneous rupture of tympanic membrane  H66.001      COVID-19 Fever, unspecified Nasal congestion Afebrile, nontoxic-appearing, NAD. VSS. DDX includes but not limited to: COVID, flu, bronchitis, pneumonia, viral URI COVID was positive today in office. Lagevrio  200mg  BID was prescribed given medication interactions. Benzonatate  200mg  TID PRN was prescribed for cough as well as Flonase  1 spray each nostril every day PRN was prescribed for congestion and post nasal drip. Encouraged rest and fluids.  Quarantine instructions provided. Strict ED  precautions were given and patient verbalized understanding.  Non-recurrent acute suppurative otitis media of right ear without spontaneous rupture of tympanic membrane Afebrile, nontoxic-appearing, NAD. VSS. DDX includes but not limited to: otitis media, otitis externa, eustachian tube dysfunction, cerumen impaction Right TM bulging and slightly erythematous with opacified fluid noted behind it.  Suspect otitis media secondary to viral infection.  Amoxicillin  875 mg twice daily was prescribed. Strict ED precautions were given and patient verbalized understanding.  ED Discharge Orders          Ordered    amoxicillin  (AMOXIL ) 875 MG tablet  Every 12 hours        07/28/24 1431    benzonatate  (TESSALON ) 200 MG capsule  3  times daily PRN        07/28/24 1431    fluticasone  (FLONASE ) 50 MCG/ACT nasal spray  Daily PRN        07/28/24 1431    molnupiravir  EUA (LAGEVRIO ) 200 MG CAPS capsule  2 times daily        07/28/24 1431             PDMP not reviewed this encounter.     Basilia Ulanda SQUIBB, PA-C 07/28/24 1439

## 2024-07-28 NOTE — ED Triage Notes (Signed)
 Patient C/O fever 101 this morning, nasal congestion, headache and body aches. Onset yesterday. Patient taking tylenol , last dose this morning. Patient states child at home is sick as well.

## 2024-07-28 NOTE — Discharge Instructions (Signed)
 You are positive for COVID. COVID is a virus. A prescription (Lagevrio ) was sent to your pharmacy to help with the duration of symptoms. If the pharmacy does not have it, that is okay and you do not have to have it.   Recommend you rest and increase oral fluids. Tylenol  as needed for fevers and aches. I have also sent cough medicine and a nasal spray as well for your symptoms.   Finally, Amoxicillin  was sent for your ear infection. This is an antibiotic. Please take it as directed.   It is important for you to pay attention to any new symptoms or worsening of your current condition. Please go directly to the Emergency Department immediately should you have any of the following symptoms: chest pain, shortness of breath or difficulty breathing.  You should remain in quarantine until you are symptom-free without taking Tylenol  or Advil  for 24 hours and are improving.  You are free to return to the public once both these criteria have been met, but you must wear a mask for the next 7 days

## 2024-07-29 ENCOUNTER — Telehealth: Payer: Self-pay

## 2024-07-29 NOTE — Telephone Encounter (Signed)
 Patient called and their identity was verified with two patient identifiers. Patient stated he was feeling the same as yesterday. He was able to pick up his medications and he started taking them. Patient instructed to give us  a call if they had any further issues.

## 2024-09-30 ENCOUNTER — Ambulatory Visit
Admission: EM | Admit: 2024-09-30 | Discharge: 2024-09-30 | Disposition: A | Discharge status: Home / Self Care | Attending: Internal Medicine | Admitting: Internal Medicine

## 2024-09-30 DIAGNOSIS — J209 Acute bronchitis, unspecified: Secondary | ICD-10-CM

## 2024-09-30 DIAGNOSIS — R051 Acute cough: Secondary | ICD-10-CM

## 2024-09-30 LAB — POCT INFLUENZA A/B
Influenza A, POC: NEGATIVE
Influenza B, POC: NEGATIVE

## 2024-09-30 MED ORDER — BENZONATATE 200 MG PO CAPS: 200.0000 mg | ORAL_CAPSULE | Freq: Three times a day (TID) | ORAL | 0 refills | Status: AC | PRN

## 2024-09-30 MED ORDER — AMOXICILLIN-POT CLAVULANATE 875-125 MG PO TABS: 1.0000 | ORAL_TABLET | Freq: Two times a day (BID) | ORAL | 0 refills | Status: AC

## 2024-09-30 NOTE — ED Triage Notes (Addendum)
 Pt states for about 6 days semi productive cough and chest congestion. He denies any known sick exposures or any other symptoms. Patient has used Vick's and tea.

## 2024-09-30 NOTE — ED Provider Notes (Signed)
 [facility]  Note:  This document was prepared using Dragon voice recognition software and may include unintentional dictation errors.  MRN: [id number] DOB: 18-Mar-1976 DATE: 09/30/24   Subjective:  Chief Complaint: No chief complaint on file.    HPI: Brett Hamilton is a 48 y.o. male presenting for cough and congestion for the past 6 days. Patient states he started with cough on Friday. He reports persistent cough. He states that it has become more productive and keeping him up at night. He reports no known sick contacts. Per EMR, patient diagnosed with COVID about 2 months ago. Reports using Vick's and drinking tea with no relief. Denies fever, nausea/vomiting, abdominal pain, otalgia, sore throat. Endorses cough. Presents NAD.  Prior to Admission medications   Medication Sig Start Date End Date Taking? Authorizing Provider  Ascorbic Acid (VITAMIN C PO) Take by mouth.   Yes [provider]  atorvastatin  (LIPITOR) 20 MG tablet 1 tablet Orally Once a day; Duration: 90 days   Yes [provider]  dapagliflozin propanediol (FARXIGA) 10 MG TABS tablet Take by mouth daily.   Yes [provider]  diphenoxylate-atropine (LOMOTIL) 2.5-0.025 MG tablet Take by mouth. 05/20/24  Yes [provider]  Dulaglutide (TRULICITY [place]) Inject 3 mg into the skin. 11/21/22  Yes [provider]  fexofenadine  (ALLEGRA ) 180 MG tablet Take 1 tablet (180 mg total) by mouth daily. 03/04/23 09/30/24 Yes Joesph Shaver Scales, PA-C  FORA Lancets MISC 1 Stick by Other route. 05/16/22  Yes [provider]  glipiZIDE (GLUCOTROL) 5 MG tablet Take 5 mg by mouth. 12/13/22  Yes [provider]  glucose blood (PRECISION QID TEST) test strip Check blood sugar twice daily (E11.9) One Touch Ultra 2 05/16/22  Yes [provider]  glucose blood (PRECISION QID TEST) test strip Check blood sugar twice daily (E11.9) One Touch Ultra 2 05/16/22  Yes [provider]  ibuprofen  (ADVIL ) 400 MG tablet Take 1 tablet (400 mg total) by mouth every 8 (eight) hours as needed for up to 30 doses. 03/04/23  Yes Joesph Shaver Scales, PA-C  Lancets (ONETOUCH ULTRASOFT) lancets  03/11/19  Yes [provider]  Multiple Vitamin (MULTIVITAMIN PO) Take by mouth.   Yes [provider]  sildenafil (VIAGRA) 50 MG tablet Take 50 mg by mouth. 06/08/24  Yes [provider]  Thiamine HCl (VITAMIN B-1 PO) Take by mouth.   Yes [provider]  amLODipine  (NORVASC ) 10 MG tablet Take 10 mg by mouth daily.    [provider]  lisinopril  (ZESTRIL ) 40 MG tablet Take 1 tablet by mouth daily. 04/25/17   [provider]  lisinopril -hydrochlorothiazide  (ZESTORETIC ) 20-25 MG tablet 1 tablet Orally Once a day; Duration: 90    [provider]  metoprolol succinate (TOPROL-XL) 50 MG 24 hr tablet Take 50 mg by mouth. 12/12/22   [provider]  metoprolol tartrate (LOPRESSOR) 50 MG tablet Take 50 mg by mouth 2 (two) times daily.    [provider]  valsartan-hydrochlorothiazide  (DIOVAN-HCT) 320-25 MG tablet Take 1 tablet by mouth daily.    [provider]     Allergies  Allergen Reactions   Metformin Other (See Comments)   Other Other (See Comments)    Blue Cheese- Swelling    History:   Past Medical History:  Diagnosis Date   Arthritis    Diabetes mellitus without complication (HCC)    History of kidney stones    Hypertension      Past  Surgical History:  Procedure Laterality Date   CHOLECYSTECTOMY     NEPHROLITHOTOMY Left 03/30/2013   Procedure: NEPHROLITHOTOMY PERCUTANEOUS;  Surgeon: Alm GORMAN Fragmin, MD;  Location: [facility];  Service: Urology;  Laterality: Left;    Family History  Problem Relation Age of Onset   Hypertension Mother     Social History   Tobacco Use   Smoking status: Never   Smokeless tobacco: Never  Substance Use Topics   Alcohol use: No   Drug use: No     Review of Systems  Constitutional:  Negative for fever.  HENT:  Negative for congestion, ear pain, rhinorrhea and sore throat.   Respiratory:  Positive for cough.   Gastrointestinal:  Negative for abdominal pain, nausea and vomiting.     Objective:   Vitals: BP 125/88 (BP Location: Right Arm)   Pulse 77   Temp 98.3 F (36.8 C) (Oral)   Resp 18   SpO2 95%   Physical Exam Constitutional:      General: He is not in acute distress.    Appearance: Normal appearance. He is well-developed. He is obese. He is not ill-appearing or toxic-appearing.  HENT:     Head: Normocephalic and atraumatic.     Right Ear: Ear canal normal. A middle ear effusion is present.     Left Ear: Ear canal normal. A middle ear effusion is present.     Mouth/Throat:     Pharynx: Oropharynx is clear. Uvula midline. No pharyngeal swelling, oropharyngeal exudate or posterior oropharyngeal erythema.     Tonsils: No tonsillar exudate or tonsillar abscesses.  Cardiovascular:     Rate and Rhythm: Normal rate and regular rhythm.     Heart sounds: Normal heart sounds.  Pulmonary:     Effort: Pulmonary effort is normal.     Breath sounds: Normal breath sounds.     Comments: Clear to auscultation bilaterally  Abdominal:     General: Bowel sounds are normal.     Palpations: Abdomen is soft.     Tenderness: There is no abdominal tenderness.  Skin:    General: Skin is warm and dry.  Neurological:     General: No focal deficit present.     Mental Status: He is alert.  Psychiatric:        Mood and Affect: Mood and affect normal.     Results:  Labs: Results for orders placed or performed during the hospital encounter of 09/30/24 (from the past 24 hours)  POCT Influenza A/B     Status: Normal   Collection Time: 09/30/24 10:04 AM  Result Value Ref Range   Influenza A, POC Negative Negative   Influenza B, POC Negative Negative    Radiology: No results found.   UC Course/Treatments:   Procedures: Procedures   Medications Ordered in UC: Medications - No data to display   Assessment and Plan :     ICD-10-CM   1. Acute bronchitis, unspecified organism  J20.9     2. Acute cough  R05.1 POCT Influenza A/B    POCT Influenza A/B     Acute Bronchitis Acute Cough Afebrile, nontoxic-appearing, NAD. VSS. DDX includes but not limited to: COVID, flu, bronchitis, pneumonia, viral URI Flu was negative. COVID not ordered due to length of symptoms and diagnosis about 2 months ago. Suspect bronchitis. Given ongoing symptoms with no improvement, Augmentin 875mg  BID was prescribed as well as Benzonatate  200mg  TID PRN was prescribed for cough. Strict ED precautions were given and patient verbalized understanding.  ED Discharge Orders          Ordered    amoxicillin -clavulanate (AUGMENTIN) 875-125 MG tablet  Every 12 hours        09/30/24 1008    benzonatate  (TESSALON ) 200 MG capsule  3 times daily PRN        09/30/24 1008             PDMP not reviewed this encounter.     Andora Krull P, PA-C 09/30/24 1014

## 2024-09-30 NOTE — Discharge Instructions (Addendum)
 Your flu test was negative. A prescription was sent for Augmentin. This is an antibiotic used to treat upper respiratory infections. Take as directed.   I have also sent you a cough medicine as well. Please take as directed.   Return in 3-4 days if no improvement. It is very important for you to pay attention to any new symptoms or worsening of your current condition.  Please go directly to the Emergency Department immediately should you begin to have any of the following symptoms: shortness of breath, chest pain or difficulty breathing.
